# Patient Record
Sex: Female | Born: 1980 | Race: White | Hispanic: No | Marital: Married | Smoking: Former smoker
Health system: Southern US, Community
[De-identification: ages and names within clinical notes are randomized; demographics above are authoritative.]

## PROBLEM LIST (undated history)

## (undated) DIAGNOSIS — Z9889 Other specified postprocedural states: Secondary | ICD-10-CM

## (undated) DIAGNOSIS — M5020 Other cervical disc displacement, unspecified cervical region: Secondary | ICD-10-CM

## (undated) DIAGNOSIS — R112 Nausea with vomiting, unspecified: Secondary | ICD-10-CM

## (undated) DIAGNOSIS — F419 Anxiety disorder, unspecified: Secondary | ICD-10-CM

## (undated) DIAGNOSIS — Z789 Other specified health status: Secondary | ICD-10-CM

## (undated) DIAGNOSIS — K219 Gastro-esophageal reflux disease without esophagitis: Secondary | ICD-10-CM

## (undated) DIAGNOSIS — O021 Missed abortion: Secondary | ICD-10-CM

## (undated) HISTORY — PX: WISDOM TOOTH EXTRACTION: SHX21

---

## 2005-10-26 ENCOUNTER — Other Ambulatory Visit: Admission: RE | Admit: 2005-10-26 | Discharge: 2005-10-26 | Payer: Self-pay | Admitting: Obstetrics and Gynecology

## 2006-06-25 ENCOUNTER — Ambulatory Visit: Payer: Self-pay | Admitting: Family Medicine

## 2006-07-26 ENCOUNTER — Ambulatory Visit: Payer: Self-pay | Admitting: Family Medicine

## 2006-11-22 ENCOUNTER — Other Ambulatory Visit: Admission: RE | Admit: 2006-11-22 | Discharge: 2006-11-22 | Payer: Self-pay | Admitting: Obstetrics and Gynecology

## 2006-12-09 ENCOUNTER — Ambulatory Visit: Payer: Self-pay | Admitting: Family Medicine

## 2007-02-11 ENCOUNTER — Ambulatory Visit: Payer: Self-pay | Admitting: Family Medicine

## 2007-03-17 ENCOUNTER — Ambulatory Visit: Payer: Self-pay | Admitting: Family Medicine

## 2007-08-24 ENCOUNTER — Ambulatory Visit: Payer: Self-pay | Admitting: Family Medicine

## 2008-04-16 ENCOUNTER — Ambulatory Visit: Payer: Self-pay | Admitting: Family Medicine

## 2008-06-06 ENCOUNTER — Encounter: Payer: Self-pay | Admitting: Obstetrics and Gynecology

## 2008-06-06 ENCOUNTER — Ambulatory Visit: Payer: Self-pay | Admitting: Obstetrics and Gynecology

## 2008-06-06 ENCOUNTER — Other Ambulatory Visit: Admission: RE | Admit: 2008-06-06 | Discharge: 2008-06-06 | Payer: Self-pay | Admitting: Obstetrics and Gynecology

## 2009-09-04 ENCOUNTER — Other Ambulatory Visit: Admission: RE | Admit: 2009-09-04 | Discharge: 2009-09-04 | Payer: Self-pay | Admitting: Obstetrics and Gynecology

## 2009-09-04 ENCOUNTER — Ambulatory Visit: Payer: Self-pay | Admitting: Obstetrics and Gynecology

## 2010-04-30 ENCOUNTER — Encounter: Admission: RE | Admit: 2010-04-30 | Discharge: 2010-04-30 | Payer: Self-pay | Admitting: Internal Medicine

## 2010-10-05 NOTE — L&D Delivery Note (Signed)
Delivery Note Pt progressed to complete and pushed well.  Had temp to 100.3 prior to pushing, and 100.6 about 30 minutes prior to delivery.  No antibiotics started due to proximity to delivery.  At 3:19 PM a viable and healthy female was delivered via Vaginal, Spontaneous Delivery (Presentation: Right Occiput Anterior).  APGAR: 9, 9; weight 7 lb 10.8 oz (3481 g).   Posterior (right) arm had hand by face and posterior shoulder delivered first.   Placenta status: Intact, Spontaneous.  Cord: 3 vessels with the following complications: None.   Anesthesia: Epidural  Episiotomy: None Lacerations: 2nd degree Suture Repair: 3.0 vicryl Est. Blood Loss (mL): 400  Mom to postpartum.  Baby to nursery-stable.  Hildy Nicholl D 09/12/2011, 3:52 PM

## 2010-11-03 ENCOUNTER — Ambulatory Visit
Admission: RE | Admit: 2010-11-03 | Discharge: 2010-11-03 | Payer: Self-pay | Source: Home / Self Care | Attending: Obstetrics and Gynecology | Admitting: Obstetrics and Gynecology

## 2010-11-03 ENCOUNTER — Other Ambulatory Visit (HOSPITAL_COMMUNITY)
Admission: RE | Admit: 2010-11-03 | Discharge: 2010-11-03 | Disposition: A | Discharge status: Home / Self Care | Payer: BC Managed Care – PPO | Source: Ambulatory Visit | Attending: Obstetrics and Gynecology | Admitting: Obstetrics and Gynecology

## 2010-11-03 ENCOUNTER — Other Ambulatory Visit: Payer: Self-pay | Admitting: Obstetrics and Gynecology

## 2010-11-03 DIAGNOSIS — Z124 Encounter for screening for malignant neoplasm of cervix: Secondary | ICD-10-CM | POA: Insufficient documentation

## 2011-01-06 ENCOUNTER — Encounter (INDEPENDENT_AMBULATORY_CARE_PROVIDER_SITE_OTHER): Payer: BC Managed Care – PPO | Admitting: Obstetrics and Gynecology

## 2011-01-06 DIAGNOSIS — N912 Amenorrhea, unspecified: Secondary | ICD-10-CM

## 2011-01-13 ENCOUNTER — Other Ambulatory Visit (INDEPENDENT_AMBULATORY_CARE_PROVIDER_SITE_OTHER): Payer: BC Managed Care – PPO

## 2011-01-13 DIAGNOSIS — N912 Amenorrhea, unspecified: Secondary | ICD-10-CM

## 2011-01-28 ENCOUNTER — Other Ambulatory Visit: Payer: BC Managed Care – PPO

## 2011-01-28 ENCOUNTER — Ambulatory Visit (INDEPENDENT_AMBULATORY_CARE_PROVIDER_SITE_OTHER): Payer: BC Managed Care – PPO | Admitting: Obstetrics and Gynecology

## 2011-01-28 DIAGNOSIS — N912 Amenorrhea, unspecified: Secondary | ICD-10-CM

## 2011-01-28 DIAGNOSIS — O9989 Other specified diseases and conditions complicating pregnancy, childbirth and the puerperium: Secondary | ICD-10-CM

## 2011-01-28 DIAGNOSIS — N831 Corpus luteum cyst of ovary, unspecified side: Secondary | ICD-10-CM

## 2011-02-02 ENCOUNTER — Other Ambulatory Visit: Payer: BC Managed Care – PPO

## 2011-02-02 ENCOUNTER — Ambulatory Visit: Payer: BC Managed Care – PPO | Admitting: Obstetrics and Gynecology

## 2011-02-02 ENCOUNTER — Ambulatory Visit (INDEPENDENT_AMBULATORY_CARE_PROVIDER_SITE_OTHER): Payer: BC Managed Care – PPO | Admitting: Obstetrics and Gynecology

## 2011-02-02 DIAGNOSIS — O9989 Other specified diseases and conditions complicating pregnancy, childbirth and the puerperium: Secondary | ICD-10-CM

## 2011-02-02 DIAGNOSIS — N912 Amenorrhea, unspecified: Secondary | ICD-10-CM

## 2011-02-27 LAB — RUBELLA ANTIBODY, IGM: Rubella: IMMUNE

## 2011-02-27 LAB — HEPATITIS B SURFACE ANTIGEN: Hepatitis B Surface Ag: NEGATIVE

## 2011-02-27 LAB — ABO/RH: RH Type: NEGATIVE

## 2011-02-27 LAB — HIV ANTIBODY (ROUTINE TESTING W REFLEX): HIV: NONREACTIVE

## 2011-02-27 LAB — ANTIBODY SCREEN: Antibody Screen: NEGATIVE

## 2011-02-27 LAB — RPR: RPR: NONREACTIVE

## 2011-08-06 LAB — STREP B DNA PROBE: GBS: NEGATIVE

## 2011-09-09 ENCOUNTER — Inpatient Hospital Stay (HOSPITAL_COMMUNITY)
Admission: AD | Admit: 2011-09-09 | Discharge: 2011-09-09 | Disposition: A | Discharge status: Home / Self Care | Payer: BC Managed Care – PPO | Source: Ambulatory Visit | Attending: Obstetrics and Gynecology | Admitting: Obstetrics and Gynecology

## 2011-09-09 ENCOUNTER — Encounter (HOSPITAL_COMMUNITY): Payer: Self-pay | Admitting: *Deleted

## 2011-09-09 DIAGNOSIS — O479 False labor, unspecified: Secondary | ICD-10-CM

## 2011-09-09 HISTORY — DX: Other specified health status: Z78.9

## 2011-09-09 MED ORDER — ZOLPIDEM TARTRATE 5 MG PO TABS: 5.0000 mg | ORAL_TABLET | Freq: Every evening | ORAL | Status: DC | PRN

## 2011-09-09 NOTE — ED Provider Notes (Signed)
History     CSN: [id number] Arrival date & time: 09/09/2011  2:32 PM   None     Pt comes in with contractions, not too uncomfortable but they are frequent  (Consider location/radiation/quality/duration/timing/severity/associated sxs/prior treatment) HPI  Past Medical History  Diagnosis Date  . No pertinent past medical history     Past Surgical History  Procedure Date  . Wisdom tooth extraction   . No past surgeries     No family history on file.  History  Substance Use Topics  . Smoking status: Former Smoker    Quit date: 11/09/2010  . Smokeless tobacco: Not on file  . Alcohol Use: No    OB History    Grav Para Term Preterm Abortions TAB SAB Ect Mult Living   1               Review of Systems  Allergies  Review of patient's allergies indicates no known allergies.  Home Medications  No current outpatient prescriptions on file.  BP 142/83  Pulse 92  Temp(Src) 98.9 F (37.2 C) (Oral)  Resp 20  Ht 5\' 9"  (1.753 m)  Wt 94.802 kg (209 lb)  BMI 30.86 kg/m2  Physical Exam  ED Course  Procedures (including critical care time)  Labs Reviewed - No data to display No results found.   No diagnosis found.    MDM  Pt observed for several hours with no cervical change cervix 70/1-2/-2 posterior.  Pt given script for Ambien and labor precautions.  Will induce next week if does not labor prior.

## 2011-09-09 NOTE — Progress Notes (Signed)
Pt informed that Dr. Senaida Ores will be down to speak with her.

## 2011-09-09 NOTE — Progress Notes (Signed)
Pt states she has been contracting since 1500, and was told that Dr.Richardson was over here.

## 2011-09-09 NOTE — Progress Notes (Signed)
Due tomorrow, ctx's 2-3 min for 2+hrs. Instructed to come here.  No bleeding or leaking.

## 2011-09-09 NOTE — Progress Notes (Signed)
Dr. Senaida Ores called and inform that pt 's cervix has not changed. Dr Senaida Ores states she will come down and talk with pt.

## 2011-09-12 ENCOUNTER — Encounter (HOSPITAL_COMMUNITY): Payer: Self-pay | Admitting: *Deleted

## 2011-09-12 ENCOUNTER — Inpatient Hospital Stay (HOSPITAL_COMMUNITY)
Admission: AD | Admit: 2011-09-12 | Discharge: 2011-09-14 | DRG: 372 | Disposition: A | Discharge status: Home / Self Care | Payer: BC Managed Care – PPO | Source: Ambulatory Visit | Attending: Obstetrics and Gynecology | Admitting: Obstetrics and Gynecology

## 2011-09-12 ENCOUNTER — Encounter (HOSPITAL_COMMUNITY): Payer: Self-pay | Admitting: Anesthesiology

## 2011-09-12 ENCOUNTER — Inpatient Hospital Stay (HOSPITAL_COMMUNITY): Payer: BC Managed Care – PPO | Admitting: Anesthesiology

## 2011-09-12 DIAGNOSIS — Z3490 Encounter for supervision of normal pregnancy, unspecified, unspecified trimester: Secondary | ICD-10-CM

## 2011-09-12 LAB — CBC
HCT: 39.3 % (ref 36.0–46.0)
MCHC: 35.1 g/dL (ref 30.0–36.0)
MCV: 93.8 fL (ref 78.0–100.0)
RDW: 13.3 % (ref 11.5–15.5)

## 2011-09-12 LAB — ABO/RH: ABO/RH(D): B NEG

## 2011-09-12 MED ORDER — IBUPROFEN 600 MG PO TABS: 600.0000 mg | ORAL_TABLET | Freq: Four times a day (QID) | ORAL | Status: DC | PRN

## 2011-09-12 MED ORDER — MEASLES, MUMPS & RUBELLA VAC ~~LOC~~ INJ
0.5000 mL | INJECTION | Freq: Once | SUBCUTANEOUS | Status: DC
  Filled 2011-09-12: qty 0.5

## 2011-09-12 MED ORDER — PHENYLEPHRINE 40 MCG/ML (10ML) SYRINGE FOR IV PUSH (FOR BLOOD PRESSURE SUPPORT): 80.0000 ug | PREFILLED_SYRINGE | INTRAVENOUS | Status: DC | PRN

## 2011-09-12 MED ORDER — PHENYLEPHRINE 40 MCG/ML (10ML) SYRINGE FOR IV PUSH (FOR BLOOD PRESSURE SUPPORT)
80.0000 ug | PREFILLED_SYRINGE | INTRAVENOUS | Status: DC | PRN
  Filled 2011-09-12: qty 5

## 2011-09-12 MED ORDER — TETANUS-DIPHTH-ACELL PERTUSSIS 5-2.5-18.5 LF-MCG/0.5 IM SUSP
0.5000 mL | Freq: Once | INTRAMUSCULAR | Status: AC
  Administered 2011-09-13: 0.5 mL via INTRAMUSCULAR
  Filled 2011-09-12: qty 0.5

## 2011-09-12 MED ORDER — DIPHENHYDRAMINE HCL 25 MG PO CAPS: 25.0000 mg | ORAL_CAPSULE | Freq: Four times a day (QID) | ORAL | Status: DC | PRN

## 2011-09-12 MED ORDER — BENZOCAINE-MENTHOL 20-0.5 % EX AERO
INHALATION_SPRAY | CUTANEOUS | Status: AC
  Filled 2011-09-12: qty 56

## 2011-09-12 MED ORDER — ZOLPIDEM TARTRATE 5 MG PO TABS: 5.0000 mg | ORAL_TABLET | Freq: Every evening | ORAL | Status: DC | PRN

## 2011-09-12 MED ORDER — ONDANSETRON HCL 4 MG PO TABS: 4.0000 mg | ORAL_TABLET | ORAL | Status: DC | PRN

## 2011-09-12 MED ORDER — FENTANYL 2.5 MCG/ML BUPIVACAINE 1/10 % EPIDURAL INFUSION (WH - ANES)
14.0000 mL/h | INTRAMUSCULAR | Status: DC
  Administered 2011-09-12 (×2): 14 mL/h via EPIDURAL
  Filled 2011-09-12 (×2): qty 60

## 2011-09-12 MED ORDER — LACTATED RINGERS IV SOLN: 500.0000 mL | INTRAVENOUS | Status: DC | PRN

## 2011-09-12 MED ORDER — ACETAMINOPHEN 325 MG PO TABS: 650.0000 mg | ORAL_TABLET | ORAL | Status: DC | PRN

## 2011-09-12 MED ORDER — OXYTOCIN BOLUS FROM INFUSION
500.0000 mL | Freq: Once | INTRAVENOUS | Status: DC
  Filled 2011-09-12: qty 500
  Filled 2011-09-12: qty 1000

## 2011-09-12 MED ORDER — OXYTOCIN 20 UNITS IN LACTATED RINGERS INFUSION - SIMPLE
125.0000 mL/h | Freq: Once | INTRAVENOUS | Status: AC
  Administered 2011-09-12: 999 mL/h via INTRAVENOUS

## 2011-09-12 MED ORDER — IBUPROFEN 600 MG PO TABS
600.0000 mg | ORAL_TABLET | Freq: Four times a day (QID) | ORAL | Status: DC
  Administered 2011-09-12 – 2011-09-14 (×7): 600 mg via ORAL
  Filled 2011-09-12 (×7): qty 1

## 2011-09-12 MED ORDER — BENZOCAINE-MENTHOL 20-0.5 % EX AERO
1.0000 "application " | INHALATION_SPRAY | CUTANEOUS | Status: DC | PRN
  Administered 2011-09-13: 1 via TOPICAL

## 2011-09-12 MED ORDER — LACTATED RINGERS IV SOLN
INTRAVENOUS | Status: DC
  Administered 2011-09-12 (×3): via INTRAVENOUS

## 2011-09-12 MED ORDER — EPHEDRINE 5 MG/ML INJ
10.0000 mg | INTRAVENOUS | Status: DC | PRN
  Administered 2011-09-12: 10 mg via INTRAVENOUS
  Filled 2011-09-12: qty 4

## 2011-09-12 MED ORDER — WITCH HAZEL-GLYCERIN EX PADS: 1.0000 "application " | MEDICATED_PAD | CUTANEOUS | Status: DC | PRN

## 2011-09-12 MED ORDER — CITRIC ACID-SODIUM CITRATE 334-500 MG/5ML PO SOLN: 30.0000 mL | ORAL | Status: DC | PRN

## 2011-09-12 MED ORDER — OXYTOCIN 10 UNIT/ML IJ SOLN
INTRAMUSCULAR | Status: AC
  Filled 2011-09-12: qty 2

## 2011-09-12 MED ORDER — OXYCODONE-ACETAMINOPHEN 5-325 MG PO TABS: 1.0000 | ORAL_TABLET | ORAL | Status: DC | PRN

## 2011-09-12 MED ORDER — OXYTOCIN 20 UNITS IN LACTATED RINGERS INFUSION - SIMPLE: 125.0000 mL/h | INTRAVENOUS | Status: DC | PRN

## 2011-09-12 MED ORDER — LANOLIN HYDROUS EX OINT: TOPICAL_OINTMENT | CUTANEOUS | Status: DC | PRN

## 2011-09-12 MED ORDER — ONDANSETRON HCL 4 MG/2ML IJ SOLN: 4.0000 mg | INTRAMUSCULAR | Status: DC | PRN

## 2011-09-12 MED ORDER — DIBUCAINE 1 % RE OINT: 1.0000 "application " | TOPICAL_OINTMENT | RECTAL | Status: DC | PRN

## 2011-09-12 MED ORDER — LIDOCAINE HCL (PF) 1 % IJ SOLN
30.0000 mL | INTRAMUSCULAR | Status: DC | PRN
  Filled 2011-09-12: qty 30

## 2011-09-12 MED ORDER — DIPHENHYDRAMINE HCL 50 MG/ML IJ SOLN
12.5000 mg | INTRAMUSCULAR | Status: DC | PRN
  Administered 2011-09-12 (×2): 12.5 mg via INTRAVENOUS
  Filled 2011-09-12: qty 1

## 2011-09-12 MED ORDER — SIMETHICONE 80 MG PO CHEW: 80.0000 mg | CHEWABLE_TABLET | ORAL | Status: DC | PRN

## 2011-09-12 MED ORDER — EPHEDRINE 5 MG/ML INJ: 10.0000 mg | INTRAVENOUS | Status: DC | PRN

## 2011-09-12 MED ORDER — SENNOSIDES-DOCUSATE SODIUM 8.6-50 MG PO TABS
2.0000 | ORAL_TABLET | Freq: Every day | ORAL | Status: DC
  Administered 2011-09-13: 2 via ORAL

## 2011-09-12 MED ORDER — OXYCODONE-ACETAMINOPHEN 5-325 MG PO TABS
1.0000 | ORAL_TABLET | ORAL | Status: DC | PRN
  Administered 2011-09-13 – 2011-09-14 (×4): 1 via ORAL
  Filled 2011-09-12 (×4): qty 1

## 2011-09-12 MED ORDER — PRENATAL PLUS 27-1 MG PO TABS
1.0000 | ORAL_TABLET | Freq: Every day | ORAL | Status: DC
  Administered 2011-09-13 – 2011-09-14 (×2): 1 via ORAL
  Filled 2011-09-12 (×2): qty 1

## 2011-09-12 MED ORDER — METHYLERGONOVINE MALEATE 0.2 MG/ML IJ SOLN: 0.2000 mg | INTRAMUSCULAR | Status: DC | PRN

## 2011-09-12 MED ORDER — MAGNESIUM HYDROXIDE 400 MG/5ML PO SUSP: 30.0000 mL | ORAL | Status: DC | PRN

## 2011-09-12 MED ORDER — LACTATED RINGERS IV SOLN: 500.0000 mL | Freq: Once | INTRAVENOUS | Status: DC

## 2011-09-12 MED ORDER — ONDANSETRON HCL 4 MG/2ML IJ SOLN
4.0000 mg | Freq: Four times a day (QID) | INTRAMUSCULAR | Status: DC | PRN
  Administered 2011-09-12: 4 mg via INTRAVENOUS
  Filled 2011-09-12: qty 2

## 2011-09-12 MED ORDER — LIDOCAINE HCL 1.5 % IJ SOLN
INTRAMUSCULAR | Status: DC | PRN
  Administered 2011-09-12 (×2): 5 mL via EPIDURAL

## 2011-09-12 MED ORDER — METHYLERGONOVINE MALEATE 0.2 MG PO TABS: 0.2000 mg | ORAL_TABLET | ORAL | Status: DC | PRN

## 2011-09-12 NOTE — H&P (Signed)
Anna Blackwell is a 30 y.o. female, G1 P0, EGA 40+ weeks presenting for evaluation of regular ctx.  On evaluation in MAU, reg ctx, VE 4-5 cm.  Pt admitted and has received an epidural.  Prenatal care essentially uncomplicated, see prenatal records for complete history.  Maternal Medical History:  Reason for admission: Reason for admission: contractions.  Contractions: Frequency: regular.   Perceived severity is strong.    Fetal activity: Perceived fetal activity is normal.      OB History    Grav Para Term Preterm Abortions TAB SAB Ect Mult Living   1              Past Medical History  Diagnosis Date  . No pertinent past medical history    Past Surgical History  Procedure Date  . Wisdom tooth extraction    Family History: family history is not on file. Social History:  reports that she quit smoking about 10 months ago. She has never used smokeless tobacco. She reports that she does not drink alcohol or use illicit drugs.  Review of Systems  Respiratory: Negative.   Cardiovascular: Negative.     Dilation: 4 Effacement (%): 80 Station: -1;0 Exam by:: Dr. Jackelyn Knife Blood pressure 115/62, pulse 86, temperature 99 F (37.2 C), temperature source Axillary, resp. rate 16, height 5\' 9"  (1.753 m), weight 94.802 kg (209 lb), SpO2 97.00%. Maternal Exam:  Uterine Assessment: Contraction strength is firm.  Contraction frequency is regular.   Abdomen: Patient reports no abdominal tenderness. Estimated fetal weight is 8 lbs.   Fetal presentation: vertex  Introitus: Normal vulva. Normal vagina.  Amniotic fluid character: meconium stained.  Pelvis: adequate for delivery.   Cervix: Cervix evaluated by digital exam.   AROM, light meconium  Fetal Exam Fetal Monitor Review: Mode: ultrasound.   Baseline rate: 130.  Variability: moderate (6-25 bpm).   Pattern: accelerations present and no decelerations.    Fetal State Assessment: Category I - tracings are  normal.     Physical Exam  Constitutional: She appears well-developed and well-nourished.  Neck: Neck supple. No thyromegaly present.  Cardiovascular: Normal rate, regular rhythm and normal heart sounds.   No murmur heard. Respiratory: Effort normal and breath sounds normal. No respiratory distress. She has no wheezes.  GI: Soft.       Gravid     Prenatal labs: ABO, Rh: B/Negative/-- (05/25 0000) Antibody: Negative (05/25 0000) Rubella: Immune (05/25 0000) RPR: Nonreactive (05/25 0000)  HBsAg: Negative (05/25 0000)  HIV: Non-reactive (05/25 0000)  GBS: Negative (11/01 0000)   Assessment/Plan: IUP at 40+ weeks in early labor.  AROM done for augmentation, light meconium.  Monitor progress.     Anna Blackwell D 09/12/2011, 9:11 AM

## 2011-09-12 NOTE — Anesthesia Procedure Notes (Signed)
Epidural Patient location during procedure: OB Start time: 09/12/2011 8:20 AM  Staffing Anesthesiologist: Brayton Caves R Performed by: anesthesiologist   Preanesthetic Checklist Completed: patient identified, site marked, surgical consent, pre-op evaluation, timeout performed, IV checked, risks and benefits discussed and monitors and equipment checked  Epidural Patient position: sitting Prep: site prepped and draped and DuraPrep Patient monitoring: continuous pulse ox and blood pressure Approach: midline Injection technique: LOR air and LOR saline  Needle:  Needle type: Tuohy  Needle gauge: 17 G Needle length: 9 cm Needle insertion depth: 5 cm cm Catheter type: closed end flexible Catheter size: 19 Gauge Catheter at skin depth: 10 cm Test dose: negative  Assessment Events: blood not aspirated, injection not painful, no injection resistance, negative IV test and no paresthesia  Additional Notes Patient identified.  Risk benefits discussed including failed block, incomplete pain control, headache, nerve damage, paralysis, blood pressure changes, nausea, vomiting, reactions to medication both toxic or allergic, and postpartum back pain.  Patient expressed understanding and wished to proceed.  All questions were answered.  Sterile technique used throughout procedure and epidural site dressed with sterile barrier dressing. No paresthesia or other complications noted.The patient did not experience any signs of intravascular injection such as tinnitus or metallic taste in mouth nor signs of intrathecal spread such as rapid motor block. Please see nursing notes for vital signs.

## 2011-09-12 NOTE — Progress Notes (Signed)
Pty having ctx on and off all night now 3 mion apart reports good fetal movement and bloody show. No SROM.

## 2011-09-12 NOTE — Anesthesia Preprocedure Evaluation (Signed)

## 2011-09-13 LAB — CBC
HCT: 32.9 % — ABNORMAL LOW (ref 36.0–46.0)
Hemoglobin: 11.1 g/dL — ABNORMAL LOW (ref 12.0–15.0)
MCH: 32.3 pg (ref 26.0–34.0)
MCHC: 33.7 g/dL (ref 30.0–36.0)

## 2011-09-13 MED ORDER — RHO D IMMUNE GLOBULIN 1500 UNIT/2ML IJ SOLN
300.0000 ug | Freq: Once | INTRAMUSCULAR | Status: AC
  Administered 2011-09-13: 300 ug via INTRAMUSCULAR
  Filled 2011-09-13: qty 2

## 2011-09-13 MED ORDER — BENZOCAINE-MENTHOL 20-0.5 % EX AERO
INHALATION_SPRAY | CUTANEOUS | Status: AC
  Administered 2011-09-13: 1 via TOPICAL
  Filled 2011-09-13: qty 56

## 2011-09-13 NOTE — Anesthesia Postprocedure Evaluation (Signed)
  Anesthesia Post-op Note  Patient: Anna Blackwell  Procedure(s) Performed: * No procedures listed *  Patient Location: mother baby  Anesthesia Type: Epidural  Level of Consciousness: awake, alert  and oriented  Airway and Oxygen Therapy: Patient Spontanous Breathing  Post-op Pain: mild  Post-op Assessment: Patient's Cardiovascular Status Stable, Respiratory Function Stable, Patent Airway, No signs of Nausea or vomiting and Pain level controlled  Post-op Vital Signs: stable  Complications: No apparent anesthesia complications

## 2011-09-13 NOTE — Progress Notes (Signed)
PPD#1 No problems Afeb, VSS Fundus- firm, NT at U-1 Continue routine care.  Discussed circumcision procedure and risks.

## 2011-09-14 LAB — RH IG WORKUP (INCLUDES ABO/RH)

## 2011-09-14 MED ORDER — INFLUENZA VIRUS VACC SPLIT PF IM SUSP
0.5000 mL | Freq: Once | INTRAMUSCULAR | Status: AC
  Administered 2011-09-14: 0.5 mL via INTRAMUSCULAR
  Filled 2011-09-14: qty 0.5

## 2011-09-14 NOTE — Discharge Summary (Signed)
Obstetric Discharge Summary Reason for Admission: onset of labor Prenatal Procedures: none Intrapartum Procedures: spontaneous vaginal delivery Postpartum Procedures: none Complications-Operative and Postpartum: 2nd degree perineal laceration Hemoglobin  Date Value Range Status  09/13/2011 11.1* 12.0-15.0 (g/dL) Final     DELTA CHECK NOTED     REPEATED TO VERIFY     HCT  Date Value Range Status  09/13/2011 32.9* 36.0-46.0 (%) Final    Discharge Diagnoses: Term Pregnancy-delivered  Discharge Information: Date: 09/14/2011 Activity: pelvic rest Diet: routine Medications: Ibuprofen Condition: stable Instructions: refer to practice specific booklet Discharge to: home Follow-up Information    Follow up with Thana Ramp D, MD. Make an appointment in 6 weeks.   Contact information:   [address] [place] [address] [phone]          Newborn Data: Live born female  Birth Weight: 7 lb 10.8 oz (3481 g) APGAR: 9, 9  Home with mother.  Rona Tomson D 09/14/2011, 9:03 AM

## 2011-09-14 NOTE — Progress Notes (Signed)
PPD #2 No problems Afeb, VSS D/c home 

## 2012-05-03 ENCOUNTER — Other Ambulatory Visit: Payer: Self-pay | Admitting: Obstetrics and Gynecology

## 2012-05-03 DIAGNOSIS — Z1231 Encounter for screening mammogram for malignant neoplasm of breast: Secondary | ICD-10-CM

## 2012-08-12 ENCOUNTER — Ambulatory Visit: Payer: BC Managed Care – PPO

## 2012-09-19 ENCOUNTER — Ambulatory Visit
Admission: RE | Admit: 2012-09-19 | Discharge: 2012-09-19 | Disposition: A | Discharge status: Home / Self Care | Payer: BC Managed Care – PPO | Source: Ambulatory Visit | Attending: Obstetrics and Gynecology | Admitting: Obstetrics and Gynecology

## 2012-09-19 DIAGNOSIS — Z1231 Encounter for screening mammogram for malignant neoplasm of breast: Secondary | ICD-10-CM

## 2012-10-11 ENCOUNTER — Ambulatory Visit: Payer: BC Managed Care – PPO

## 2013-11-06 ENCOUNTER — Other Ambulatory Visit: Payer: Self-pay | Admitting: Dermatology

## 2014-03-23 ENCOUNTER — Ambulatory Visit (INDEPENDENT_AMBULATORY_CARE_PROVIDER_SITE_OTHER): Payer: BC Managed Care – PPO | Admitting: Internal Medicine

## 2014-03-23 ENCOUNTER — Ambulatory Visit (INDEPENDENT_AMBULATORY_CARE_PROVIDER_SITE_OTHER): Payer: BC Managed Care – PPO

## 2014-03-23 VITALS — BP 106/72 | HR 79 | Temp 98.1°F | Resp 18 | Ht 68.0 in | Wt 158.4 lb

## 2014-03-23 DIAGNOSIS — R071 Chest pain on breathing: Secondary | ICD-10-CM

## 2014-03-23 DIAGNOSIS — R0789 Other chest pain: Secondary | ICD-10-CM

## 2014-03-23 DIAGNOSIS — R059 Cough, unspecified: Secondary | ICD-10-CM

## 2014-03-23 DIAGNOSIS — R05 Cough: Secondary | ICD-10-CM

## 2014-03-23 MED ORDER — HYDROCODONE-HOMATROPINE 5-1.5 MG/5ML PO SYRP: 5.0000 mL | ORAL_SOLUTION | Freq: Four times a day (QID) | ORAL | Status: DC | PRN

## 2014-03-23 MED ORDER — PREDNISONE 20 MG PO TABS: ORAL_TABLET | ORAL | Status: DC

## 2014-03-23 MED ORDER — FLUTICASONE PROPIONATE 50 MCG/ACT NA SUSP: 2.0000 | Freq: Every day | NASAL | Status: DC

## 2014-03-23 MED ORDER — MELOXICAM 15 MG PO TABS: 15.0000 mg | ORAL_TABLET | Freq: Every day | ORAL | Status: DC

## 2014-03-23 NOTE — Progress Notes (Addendum)
Subjective:  This chart was scribed for Anna Siaobert Doolittle, MD by Quintella ReichertMatthew Underwood, Scribe.  [facility] and the patient's care was started at 10:47 AM.   Patient ID: Anna Blackwell, female    DOB: [birth year], 33 y.o.   MRN: [id number]  Chief Complaint  Patient presents with   Cough    coughing so much and hard that she thinks she cracked her rib x 2 weeks    HPI  HPI Comments: Anna GlassChanteal S Nolasco is a 33 y.o. female who [facility] of right rib pain secondary to a cough.  She states she has had persistent cold symptoms for the past few weeks which she characterizes as coughing fits, rhinorrhea, and congestion.  This has been improving overall but she has continued to have a cough. Cough was waking her up at night but no longer is.  She had a sore throat initially but this has since resolved.  She reports some night sweats last week as well.  She also states she is sneezing.  She denies associated SOB, fevers, vomiting, diarrhea, constipation, ear problems, eye itching or discharge.  She has been taking Robitussin and Benadryl.  She admits to h/o seasonal allergies when she moved here several years ago but states she does not typically have allergies.  4 days ago she developed mild right rib pain which has persisted since then.  This morning at work during a coughing fit she "felt my rib crack" and had sudden onset of severe pain to her right ribs with associated SOB.  She states she was unable to stand straight due to her pain.  Pt notes that she had a similar episode of pain in the same area last year, also due to coughing.  Pt has an IUD.   Patient Active Problem List   Diagnosis Date Noted   Normal pregnancy 09/12/2011   SVD (spontaneous vaginal delivery) 09/12/2011    Past Medical History  Diagnosis Date   No pertinent past medical history     Past Surgical History  Procedure Laterality Date   Wisdom tooth extraction       Prior to Admission medications   Medication Sig Start Date End Date Taking? Authorizing Provider  acetaminophen (TYLENOL) 500 MG tablet Take 1,000 mg by mouth every 6 (six) hours as needed. Patient took this medication for back pain.     Historical Provider, MD  zolpidem (AMBIEN) 5 MG tablet Take 1 tablet (5 mg total) by mouth at bedtime as needed for sleep. 09/09/11 09/08/12  Oliver PilaKathy W Richardson, MD     Review of Systems  Constitutional: Negative for fever, activity change, appetite change, fatigue and unexpected weight change.       One episode of night sweats  HENT: Positive for congestion, rhinorrhea, sneezing and sore throat (resolved). Negative for ear pain and hearing loss.   Eyes: Negative for discharge and itching.  Respiratory: Positive for cough. Negative for shortness of breath.   Cardiovascular: Positive for chest pain (right rib pain).  Gastrointestinal: Negative for vomiting, diarrhea and constipation.        Objective:   Physical Exam  Nursing note and vitals reviewed. Constitutional: She is oriented to person, place, and time. She appears well-developed and well-nourished. No distress.  HENT:  Head: Normocephalic and atraumatic.  Right Ear: Tympanic membrane and ear canal normal.  Left Ear: Tympanic membrane and ear canal normal.  Mouth/Throat: Oropharynx is clear and moist and mucous  membranes are normal. No oropharyngeal exudate, posterior oropharyngeal edema or posterior oropharyngeal erythema.  Nose has boggy turbinates with clear rhinorrhea  Eyes: Conjunctivae and EOM are normal. Pupils are equal, round, and reactive to light. Right eye exhibits no discharge. Left eye exhibits no discharge. No scleral icterus.  Neck: Neck supple. No tracheal deviation present. No thyromegaly present.  Cardiovascular: Normal rate and regular rhythm.   No murmur heard. Pulmonary/Chest: Effort normal and breath sounds normal. No respiratory distress. She has no wheezes. She has  no rales. She exhibits tenderness (very tender to right lower anterior and lateral ribs, without ecchymosis or defect).  Musculoskeletal: Normal range of motion.  Lymphadenopathy:    She has no cervical adenopathy.  Neurological: She is alert and oriented to person, place, and time.  Skin: Skin is warm and dry.  Psychiatric: She has a normal mood and affect. Her behavior is normal.     BP 106/72   Pulse 79   Temp(Src) 98.1 F (36.7 C) (Oral)   Resp 18   Ht 5\' 8"  (1.727 m)   Wt 158 lb 6.4 oz (71.85 kg)   BMI 24.09 kg/m2   SpO2 100%  [facility] reading (PRIMARY) by  Dr. Merla Richesoolittle= No rib Fx/Chest clear      Assessment & Plan:  Chest wall pain - this is consistent with a chest muscle tear coughing Cough--very likely related to persistent post nasal drainage with inflammation in the trachea  Underlying allergic rhinitis is possible  Rib belt Progressive stretching Ice as needed  Meds ordered this encounter  Medications   meloxicam (MOBIC) 15 MG tablet    Sig: Take 1 tablet (15 mg total) by mouth daily.    Dispense:  30 tablet    Refill:  0   predniSONE (DELTASONE) 20 MG tablet    Sig: 3/3/2/2/1/1 single daily dose for 6 days    Dispense:  12 tablet    Refill:  0   fluticasone (FLONASE) 50 MCG/ACT nasal spray    Sig: Place 2 sprays into both nostrils daily.    Dispense:  16 g    Refill:  6   HYDROcodone-homatropine (HYCODAN) 5-1.5 MG/5ML syrup    Sig: Take 5 mLs by mouth every 6 (six) hours as needed for cough.    Dispense:  120 mL    Refill:  0   Recheck 2 weeks if not responding      I have completed the patient encounter in its entirety as documented by the scribe, with editing by me where necessary. Robert P. Merla Richesoolittle, M.D.

## 2015-07-20 IMAGING — CR DG RIBS W/ CHEST 3+V*R*
4 series · 4 of 4 positions shown · non-contrast
Comparison: No previous studies are available for comparison.

CLINICAL DATA: Chest wall pain

EXAM:
RIGHT RIBS AND CHEST - 3+ VIEW

[PA]
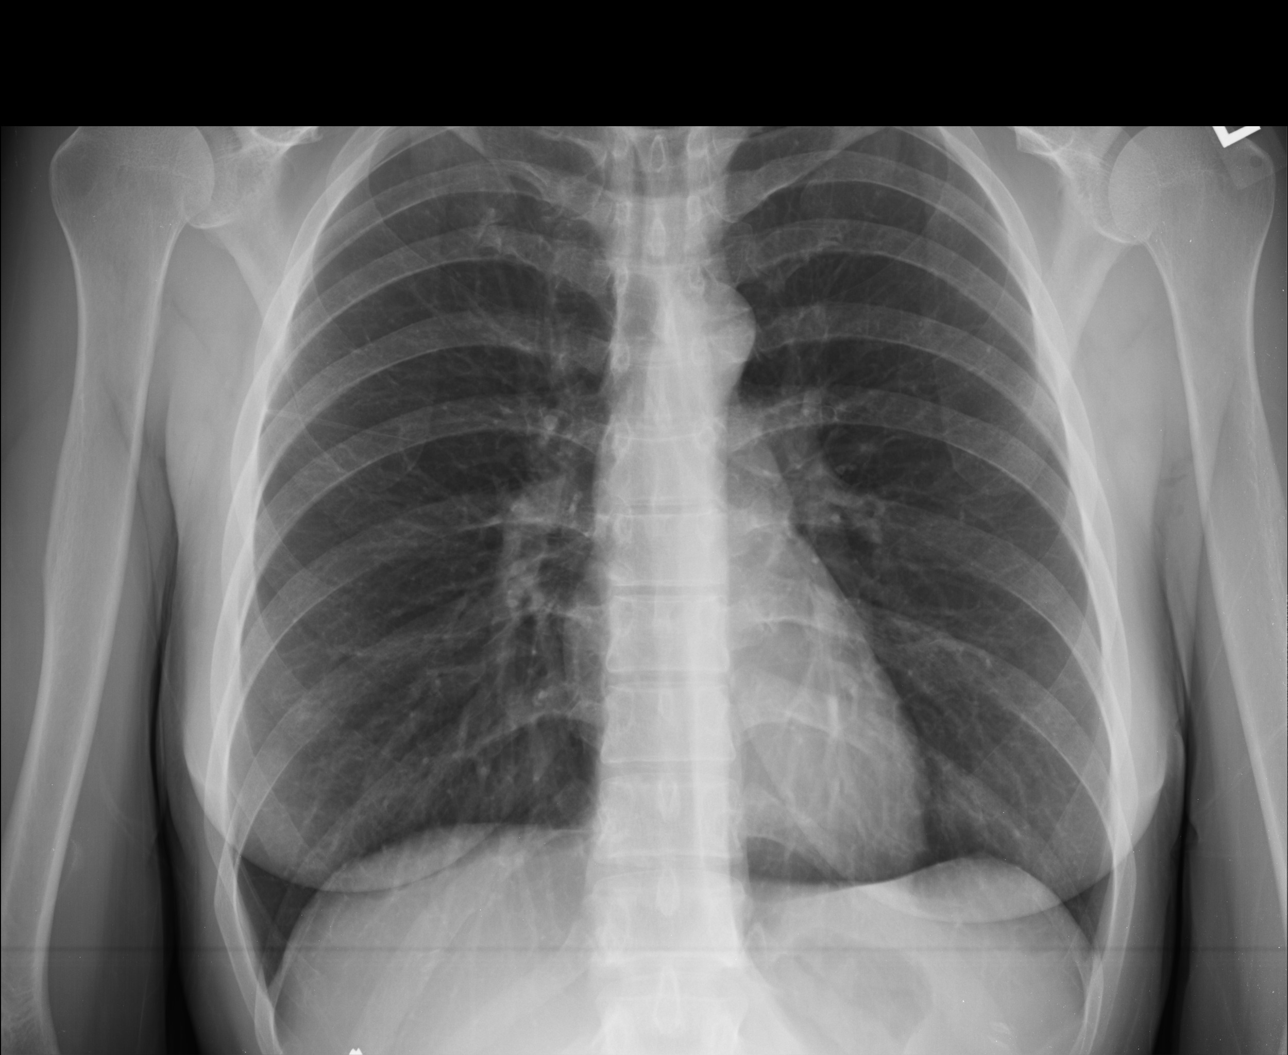

[rao]
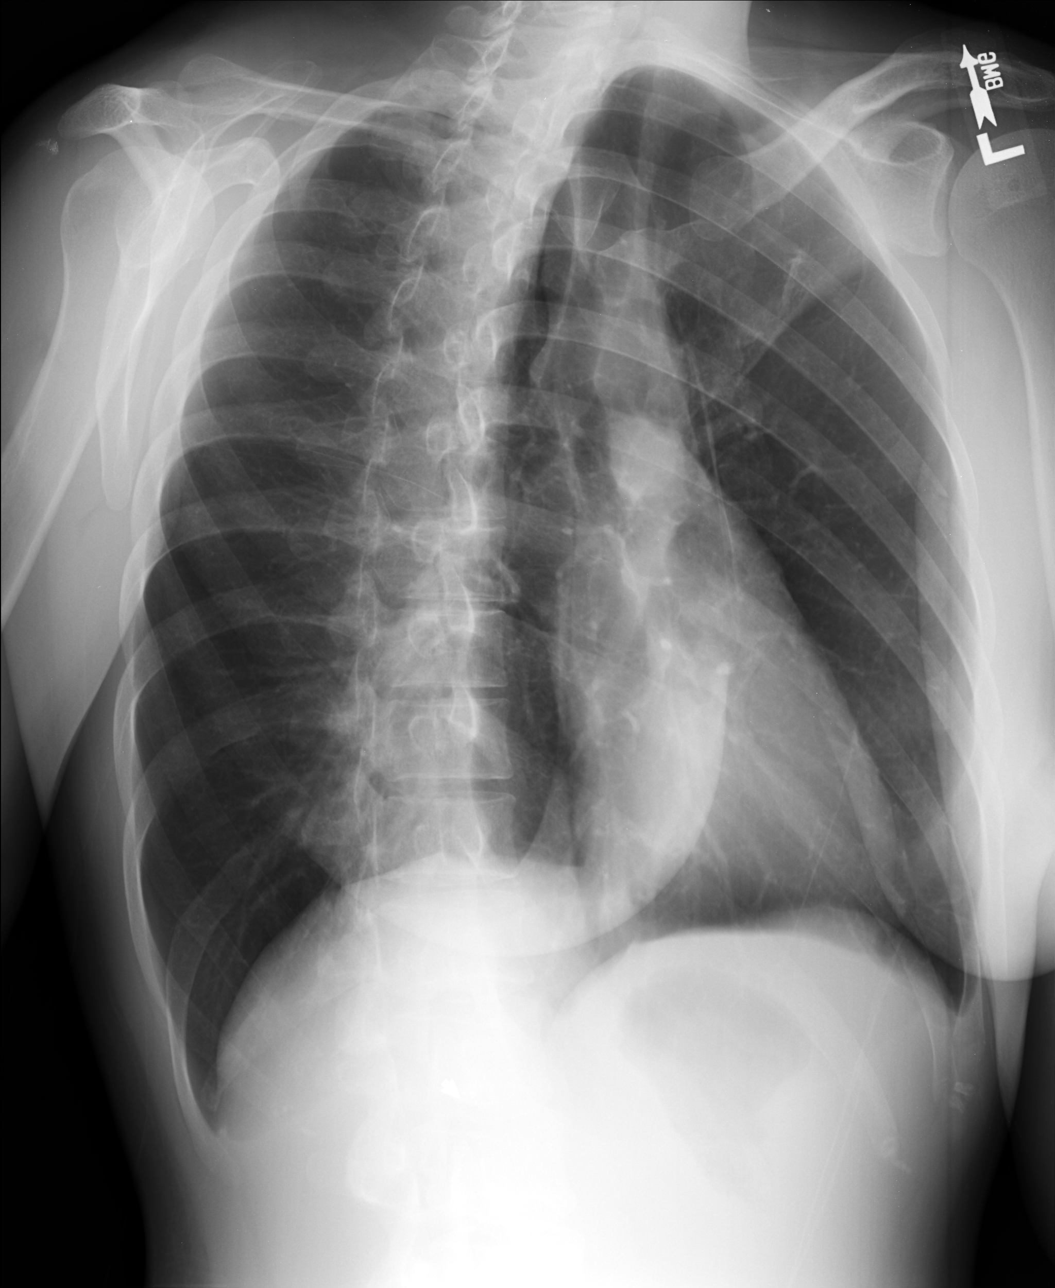

[lao]
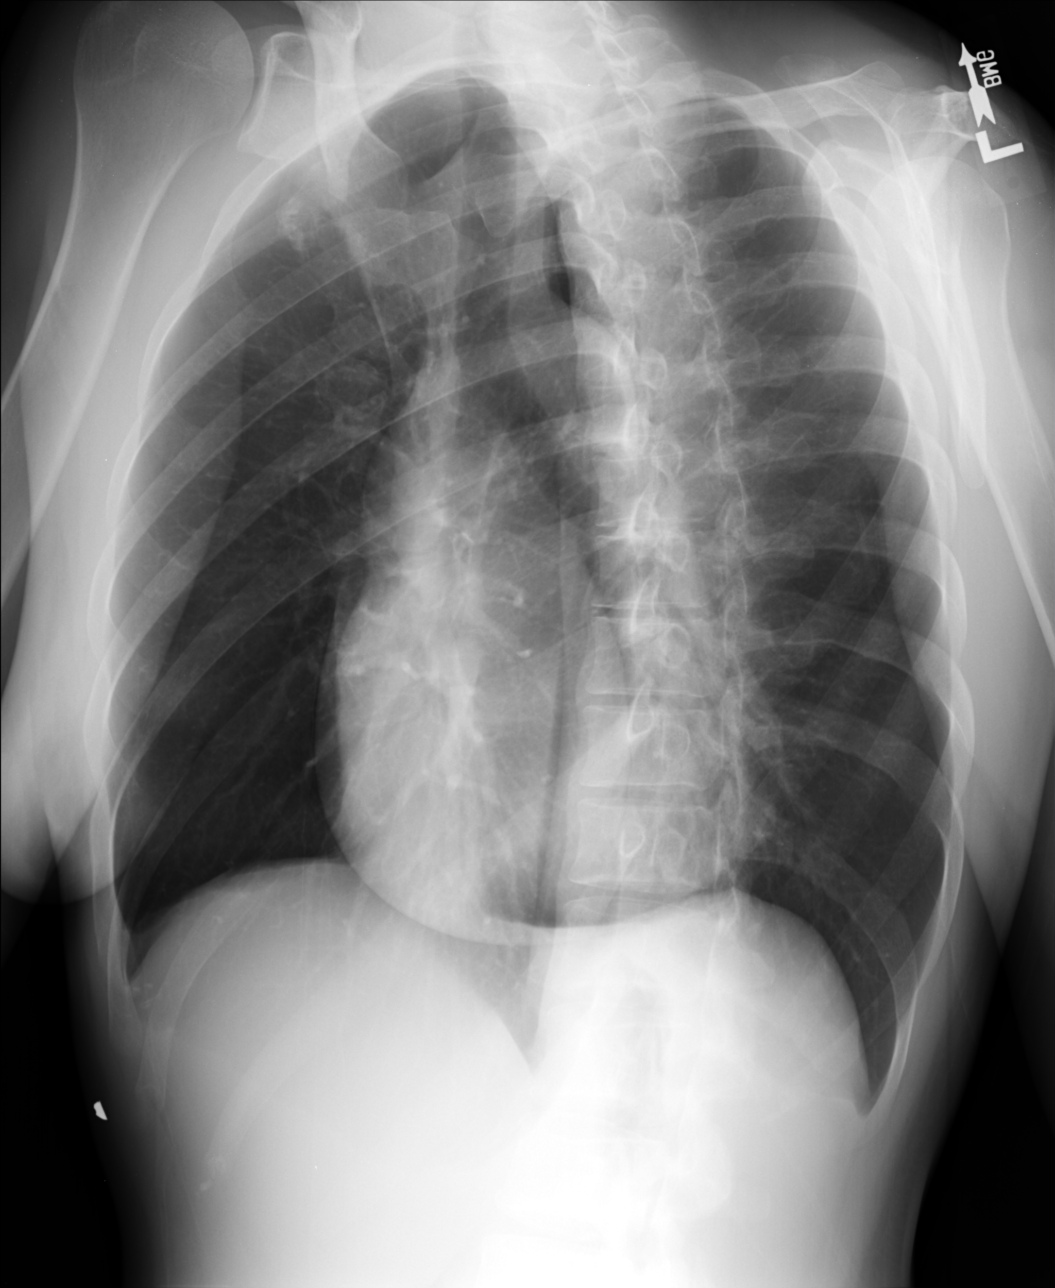

[AP]
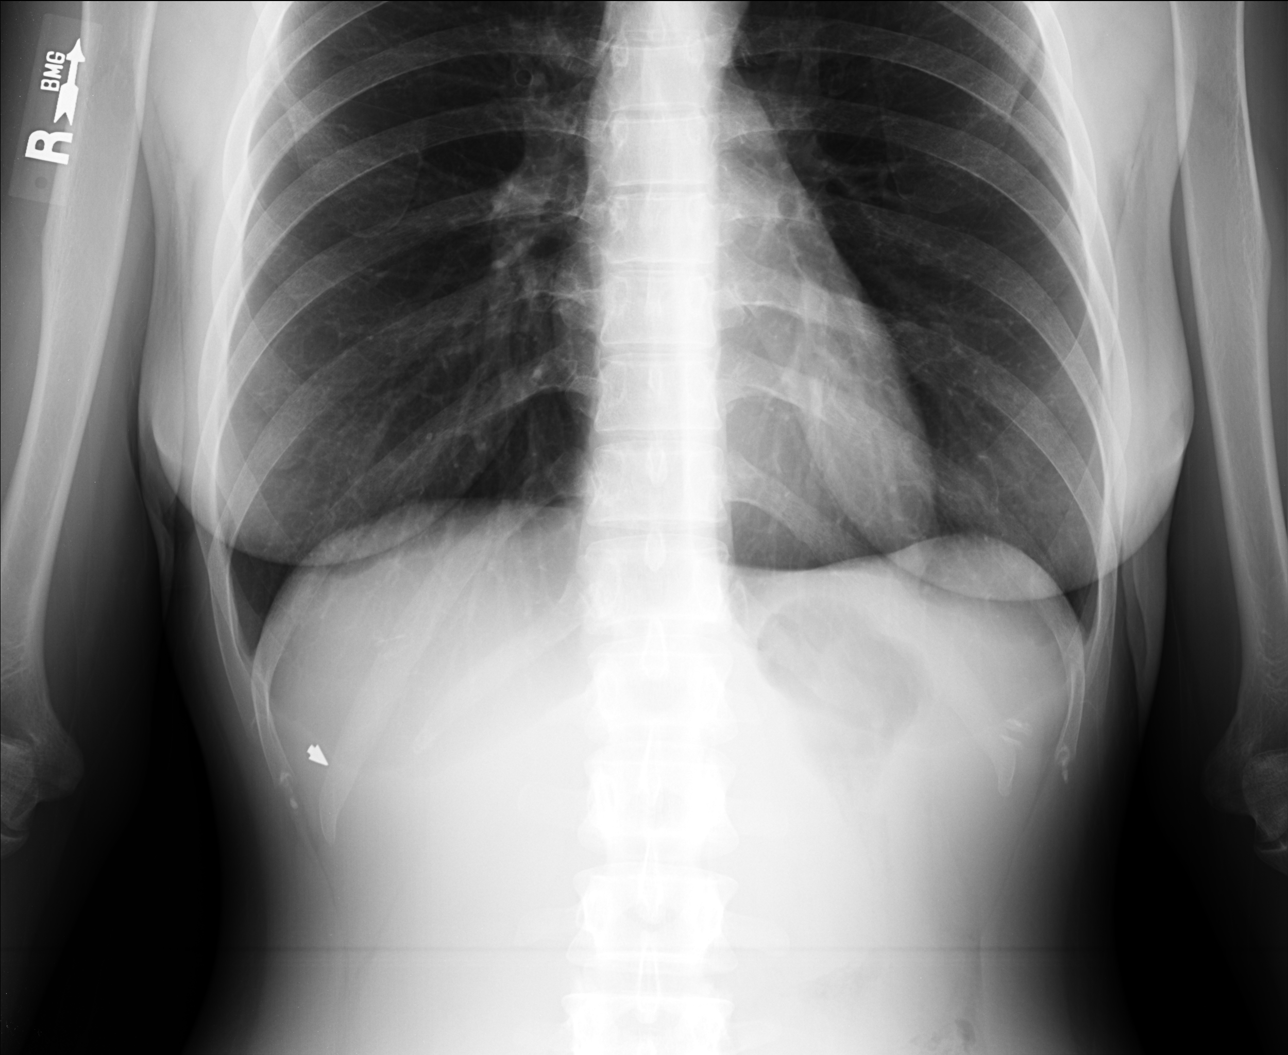

[4 of 4 positions shown; findings below may reference images not displayed]

FINDINGS: The lungs are hyperinflated and clear. The heart and mediastinal
structures are normal. There is no pleural effusion or pneumothorax.
Bilateral rib detail films reveal the ribs to be adequately
mineralized. There is no acute fracture. Subtle lucency in the
lateral aspect of the left fifth rib is seen on one view only and
fell to not reflect a true fracture.
IMPRESSION: There is no acute cardiopulmonary abnormality. No definite rib
fracture is demonstrated.

## 2016-02-11 DIAGNOSIS — N911 Secondary amenorrhea: Secondary | ICD-10-CM | POA: Diagnosis not present

## 2016-02-11 DIAGNOSIS — Z3201 Encounter for pregnancy test, result positive: Secondary | ICD-10-CM | POA: Diagnosis not present

## 2016-03-03 DIAGNOSIS — Z3481 Encounter for supervision of other normal pregnancy, first trimester: Secondary | ICD-10-CM | POA: Diagnosis not present

## 2016-03-03 DIAGNOSIS — Z36 Encounter for antenatal screening of mother: Secondary | ICD-10-CM | POA: Diagnosis not present

## 2016-03-03 DIAGNOSIS — O26841 Uterine size-date discrepancy, first trimester: Secondary | ICD-10-CM | POA: Diagnosis not present

## 2016-03-03 DIAGNOSIS — Z3A09 9 weeks gestation of pregnancy: Secondary | ICD-10-CM | POA: Diagnosis not present

## 2016-03-03 DIAGNOSIS — O3680X0 Pregnancy with inconclusive fetal viability, not applicable or unspecified: Secondary | ICD-10-CM | POA: Diagnosis not present

## 2016-03-24 DIAGNOSIS — O021 Missed abortion: Secondary | ICD-10-CM | POA: Diagnosis not present

## 2016-03-24 DIAGNOSIS — O020 Blighted ovum and nonhydatidiform mole: Secondary | ICD-10-CM | POA: Diagnosis not present

## 2016-03-27 ENCOUNTER — Encounter (HOSPITAL_COMMUNITY): Payer: Self-pay

## 2016-03-28 ENCOUNTER — Encounter (HOSPITAL_COMMUNITY): Payer: Self-pay | Admitting: Obstetrics and Gynecology

## 2016-03-28 DIAGNOSIS — O021 Missed abortion: Secondary | ICD-10-CM

## 2016-03-28 HISTORY — DX: Missed abortion: O02.1

## 2016-03-28 NOTE — H&P (Signed)
Anna Blackwell is an 35 y.o. female G2P1011 with Missed Ab, measuring 9 week.  Normal early US, when presented for Nuchal thickness US, missed Ab noted.  Pt tried cytotec without results, decided to proceed with D&C.  D/W pt r/b/a of procedure.    Pertinent Gynecological History: OB History: G2, P1001 G1 40wk SVD 7#10 G2 present  No abn pap, last 10/15 WNL No STDs  Menstrual History:  Patient's last menstrual period was 12/29/2015 (approximate).    Past Medical History  Diagnosis Date  . No pertinent past medical history   . Anxiety   . PONV (postoperative nausea and vomiting)   . Missed abortion 03/28/2016    Past Surgical History  Procedure Laterality Date  . Wisdom tooth extraction      FH: Alzheimer's disease, HTN, prostate CA, ovarian CA, brain CA, breast CA  Social History:  reports that she quit smoking about 5 years ago. She has never used smokeless tobacco. She reports that she does not drink alcohol or use illicit drugs.Social research officer, [employer], married  Allergies: No Known Allergies  No prescriptions prior to admission  Meds cytotec, ibuprofen, phenergan, percocet, PNV  Review of Systems  Constitutional: Negative.   HENT: Negative.   Eyes: Negative.   Respiratory: Negative.   Cardiovascular: Negative.   Gastrointestinal: Negative.   Genitourinary: Negative.   Musculoskeletal: Negative.   Skin: Negative.   Neurological: Negative.   Psychiatric/Behavioral: Negative.     Last menstrual period 12/29/2015, unknown if currently breastfeeding. Physical Exam  Constitutional: She is oriented to person, place, and time. She appears well-developed and well-nourished.  Anxious and upset  HENT:  Head: Normocephalic and atraumatic.  Cardiovascular: Normal rate and regular rhythm.   Respiratory: Effort normal and breath sounds normal. No respiratory distress. She has no wheezes.  GI: Soft. Bowel sounds are normal. She exhibits no distension. There is no tenderness.   Musculoskeletal: Normal range of motion.  Neurological: She is alert and oriented to person, place, and time.  Skin: Skin is warm and dry.  Psychiatric: She has a normal mood and affect. Her behavior is normal.   Blood type B neg  Nl 9wk US At 12 wk US measuring 9wk size, no FHTs  Assessment/Plan: 34yo G2P1001 with missed ab for D&C D/w pt r/b/a Doxy for prophylaxis   Bovard-Stuckert, Anna Blackwell 03/28/2016, 11:01 PM

## 2016-03-30 ENCOUNTER — Encounter (HOSPITAL_COMMUNITY)
Admission: RE | Disposition: A | Discharge status: Home / Self Care | Payer: Self-pay | Source: Ambulatory Visit | Attending: Obstetrics and Gynecology

## 2016-03-30 ENCOUNTER — Ambulatory Visit (HOSPITAL_COMMUNITY): Payer: BLUE CROSS/BLUE SHIELD | Admitting: Anesthesiology

## 2016-03-30 ENCOUNTER — Ambulatory Visit (HOSPITAL_COMMUNITY): Payer: BLUE CROSS/BLUE SHIELD

## 2016-03-30 ENCOUNTER — Ambulatory Visit (HOSPITAL_COMMUNITY)
Admission: RE | Admit: 2016-03-30 | Discharge: 2016-03-30 | Disposition: A | Discharge status: Home / Self Care | Payer: BLUE CROSS/BLUE SHIELD | Source: Ambulatory Visit | Attending: Obstetrics and Gynecology | Admitting: Obstetrics and Gynecology

## 2016-03-30 ENCOUNTER — Encounter (HOSPITAL_COMMUNITY): Payer: Self-pay | Admitting: *Deleted

## 2016-03-30 DIAGNOSIS — O021 Missed abortion: Secondary | ICD-10-CM | POA: Diagnosis not present

## 2016-03-30 DIAGNOSIS — Z3A09 9 weeks gestation of pregnancy: Secondary | ICD-10-CM | POA: Diagnosis not present

## 2016-03-30 DIAGNOSIS — Z87891 Personal history of nicotine dependence: Secondary | ICD-10-CM | POA: Insufficient documentation

## 2016-03-30 DIAGNOSIS — F419 Anxiety disorder, unspecified: Secondary | ICD-10-CM | POA: Insufficient documentation

## 2016-03-30 DIAGNOSIS — Z9889 Other specified postprocedural states: Secondary | ICD-10-CM

## 2016-03-30 HISTORY — DX: Other specified postprocedural states: Z98.890

## 2016-03-30 HISTORY — DX: Missed abortion: O02.1

## 2016-03-30 HISTORY — PX: DILATION AND EVACUATION: SHX1459

## 2016-03-30 HISTORY — DX: Nausea with vomiting, unspecified: R11.2

## 2016-03-30 HISTORY — DX: Anxiety disorder, unspecified: F41.9

## 2016-03-30 LAB — CBC
HCT: 41.6 % (ref 36.0–46.0)
Hemoglobin: 14.4 g/dL (ref 12.0–15.0)
MCH: 31.2 pg (ref 26.0–34.0)
MCHC: 34.6 g/dL (ref 30.0–36.0)
MCV: 90.2 fL (ref 78.0–100.0)
Platelets: 137 K/uL — ABNORMAL LOW (ref 150–400)
RBC: 4.61 MIL/uL (ref 3.87–5.11)
RDW: 12.9 % (ref 11.5–15.5)
WBC: 5.8 K/uL (ref 4.0–10.5)

## 2016-03-30 LAB — TYPE AND SCREEN
ABO/RH(D): B NEG
ANTIBODY SCREEN: NEGATIVE

## 2016-03-30 SURGERY — DILATION AND EVACUATION, UTERUS
Anesthesia: Monitor Anesthesia Care

## 2016-03-30 MED ORDER — SCOPOLAMINE 1 MG/3DAYS TD PT72
MEDICATED_PATCH | TRANSDERMAL | Status: AC
  Administered 2016-03-30: 1.5 mg via TRANSDERMAL
  Filled 2016-03-30: qty 1

## 2016-03-30 MED ORDER — 0.9 % SODIUM CHLORIDE (POUR BTL) OPTIME
TOPICAL | Status: DC | PRN
  Administered 2016-03-30: 1000 mL

## 2016-03-30 MED ORDER — ACETAMINOPHEN 160 MG/5ML PO SOLN: 325.0000 mg | ORAL | Status: DC | PRN

## 2016-03-30 MED ORDER — RHO D IMMUNE GLOBULIN 1500 UNIT/2ML IJ SOSY
300.0000 ug | PREFILLED_SYRINGE | Freq: Once | INTRAMUSCULAR | Status: AC
  Administered 2016-03-30: 300 ug via INTRAMUSCULAR
  Filled 2016-03-30: qty 2

## 2016-03-30 MED ORDER — ACETAMINOPHEN 325 MG PO TABS: 325.0000 mg | ORAL_TABLET | ORAL | Status: DC | PRN

## 2016-03-30 MED ORDER — FENTANYL CITRATE (PF) 100 MCG/2ML IJ SOLN: 25.0000 ug | INTRAMUSCULAR | Status: DC | PRN

## 2016-03-30 MED ORDER — ONDANSETRON HCL 4 MG/2ML IJ SOLN
INTRAMUSCULAR | Status: DC | PRN
  Administered 2016-03-30: 4 mg via INTRAVENOUS

## 2016-03-30 MED ORDER — FENTANYL CITRATE (PF) 100 MCG/2ML IJ SOLN
INTRAMUSCULAR | Status: DC | PRN
Start: 2016-03-30 — End: 2016-03-30
  Administered 2016-03-30: 100 ug via INTRAVENOUS

## 2016-03-30 MED ORDER — PROPOFOL 10 MG/ML IV BOLUS
INTRAVENOUS | Status: DC | PRN
  Administered 2016-03-30: 30 mg via INTRAVENOUS
  Administered 2016-03-30: 50 mg via INTRAVENOUS
  Administered 2016-03-30 (×2): 30 mg via INTRAVENOUS
  Administered 2016-03-30: 50 mg via INTRAVENOUS
  Administered 2016-03-30 (×2): 30 mg via INTRAVENOUS

## 2016-03-30 MED ORDER — DOXYCYCLINE HYCLATE 100 MG PO TABS
100.0000 mg | ORAL_TABLET | Freq: Once | ORAL | Status: AC
  Administered 2016-03-30: 100 mg via ORAL

## 2016-03-30 MED ORDER — DOXYCYCLINE HYCLATE 100 MG PO TABS
ORAL_TABLET | ORAL | Status: AC
  Administered 2016-03-30: 100 mg via ORAL
  Filled 2016-03-30: qty 2

## 2016-03-30 MED ORDER — LACTATED RINGERS IV SOLN: INTRAVENOUS | Status: DC

## 2016-03-30 MED ORDER — MIDAZOLAM HCL 2 MG/2ML IJ SOLN
INTRAMUSCULAR | Status: AC
  Filled 2016-03-30: qty 2

## 2016-03-30 MED ORDER — LACTATED RINGERS IV SOLN
INTRAVENOUS | Status: DC
  Administered 2016-03-30 (×2): via INTRAVENOUS

## 2016-03-30 MED ORDER — SODIUM CHLORIDE 0.9 % IV SOLN
INTRAVENOUS | Status: DC
  Administered 2016-03-30: 14:00:00 via INTRAVENOUS

## 2016-03-30 MED ORDER — ONDANSETRON HCL 4 MG/2ML IJ SOLN
INTRAMUSCULAR | Status: AC
  Filled 2016-03-30: qty 2

## 2016-03-30 MED ORDER — PROPOFOL 10 MG/ML IV BOLUS
INTRAVENOUS | Status: AC
  Filled 2016-03-30: qty 20

## 2016-03-30 MED ORDER — IBUPROFEN 800 MG PO TABS: 800.0000 mg | ORAL_TABLET | Freq: Three times a day (TID) | ORAL | Status: DC | PRN

## 2016-03-30 MED ORDER — LIDOCAINE HCL (CARDIAC) 20 MG/ML IV SOLN
INTRAVENOUS | Status: AC
  Filled 2016-03-30: qty 5

## 2016-03-30 MED ORDER — LIDOCAINE HCL (CARDIAC) 20 MG/ML IV SOLN
INTRAVENOUS | Status: DC | PRN
  Administered 2016-03-30: 40 mg via INTRAVENOUS

## 2016-03-30 MED ORDER — MIDAZOLAM HCL 5 MG/5ML IJ SOLN
INTRAMUSCULAR | Status: DC | PRN
  Administered 2016-03-30: 2 mg via INTRAVENOUS

## 2016-03-30 MED ORDER — KETOROLAC TROMETHAMINE 30 MG/ML IJ SOLN
INTRAMUSCULAR | Status: AC
  Filled 2016-03-30: qty 1

## 2016-03-30 MED ORDER — FENTANYL CITRATE (PF) 250 MCG/5ML IJ SOLN
INTRAMUSCULAR | Status: AC
  Filled 2016-03-30: qty 5

## 2016-03-30 MED ORDER — LIDOCAINE HCL 2 % IJ SOLN
INTRAMUSCULAR | Status: DC | PRN
  Administered 2016-03-30: 20 mL

## 2016-03-30 MED ORDER — OXYCODONE HCL 5 MG/5ML PO SOLN: 5.0000 mg | Freq: Once | ORAL | Status: DC | PRN

## 2016-03-30 MED ORDER — DEXAMETHASONE SODIUM PHOSPHATE 4 MG/ML IJ SOLN
INTRAMUSCULAR | Status: DC | PRN
  Administered 2016-03-30: 4 mg via INTRAVENOUS

## 2016-03-30 MED ORDER — OXYCODONE HCL 5 MG PO TABS: 5.0000 mg | ORAL_TABLET | Freq: Once | ORAL | Status: DC | PRN

## 2016-03-30 MED ORDER — KETOROLAC TROMETHAMINE 30 MG/ML IJ SOLN
INTRAMUSCULAR | Status: DC | PRN
  Administered 2016-03-30: 30 mg via INTRAVENOUS

## 2016-03-30 MED ORDER — SCOPOLAMINE 1 MG/3DAYS TD PT72
1.0000 | MEDICATED_PATCH | Freq: Once | TRANSDERMAL | Status: DC
  Administered 2016-03-30: 1.5 mg via TRANSDERMAL

## 2016-03-30 MED ORDER — LIDOCAINE HCL 2 % IJ SOLN
INTRAMUSCULAR | Status: AC
  Filled 2016-03-30: qty 20

## 2016-03-30 MED ORDER — DOXYCYCLINE HYCLATE 100 MG PO TABS: 200.0000 mg | ORAL_TABLET | Freq: Once | ORAL | Status: DC

## 2016-03-30 MED ORDER — DEXAMETHASONE SODIUM PHOSPHATE 4 MG/ML IJ SOLN
INTRAMUSCULAR | Status: AC
  Filled 2016-03-30: qty 1

## 2016-03-30 SURGICAL SUPPLY — 18 items
CATH ROBINSON RED A/P 16FR (CATHETERS) ×2 IMPLANT
CLOTH BEACON ORANGE TIMEOUT ST (SAFETY) ×2 IMPLANT
DECANTER SPIKE VIAL GLASS SM (MISCELLANEOUS) ×2 IMPLANT
GLOVE BIO SURGEON STRL SZ 6.5 (GLOVE) ×2 IMPLANT
GLOVE BIOGEL PI IND STRL 7.0 (GLOVE) ×1 IMPLANT
GLOVE BIOGEL PI INDICATOR 7.0 (GLOVE) ×1
GOWN STRL REUS W/TWL LRG LVL3 (GOWN DISPOSABLE) ×4 IMPLANT
KIT BERKELEY 1ST TRIMESTER 3/8 (MISCELLANEOUS) ×2 IMPLANT
NS IRRIG 1000ML POUR BTL (IV SOLUTION) ×2 IMPLANT
PACK VAGINAL MINOR WOMEN LF (CUSTOM PROCEDURE TRAY) ×2 IMPLANT
PAD OB MATERNITY 4.3X12.25 (PERSONAL CARE ITEMS) ×2 IMPLANT
PAD PREP 24X48 CUFFED NSTRL (MISCELLANEOUS) ×2 IMPLANT
SET BERKELEY SUCTION TUBING (SUCTIONS) ×2 IMPLANT
TOWEL OR 17X24 6PK STRL BLUE (TOWEL DISPOSABLE) ×4 IMPLANT
VACURETTE 10 RIGID CVD (CANNULA) IMPLANT
VACURETTE 7MM CVD STRL WRAP (CANNULA) IMPLANT
VACURETTE 8 RIGID CVD (CANNULA) IMPLANT
VACURETTE 9 RIGID CVD (CANNULA) ×2 IMPLANT

## 2016-03-30 NOTE — Anesthesia Postprocedure Evaluation (Signed)
Anesthesia Post Note  Patient: Cale S Wirz  Procedure(s) Performed: Procedure(s) (LRB): DILATATION AND EVACUATION WITH INTRAOPERATIVE ULTRASOUND (N/A)  Patient location during evaluation: PACU Anesthesia Type: MAC Level of consciousness: awake and alert Pain management: pain level controlled Vital Signs Assessment: post-procedure vital signs reviewed and stable Respiratory status: spontaneous breathing Cardiovascular status: stable Postop Assessment: no signs of nausea or vomiting Anesthetic complications: no     Last Vitals:  Filed Vitals:   03/30/16 1345 03/30/16 1400  BP: 103/63 105/64  Pulse: 55 53  Temp:  38.1 C  Resp: 17 19    Last Pain:  Filed Vitals:   03/30/16 1411  PainSc: 2    Pain Goal: Patients Stated Pain Goal: 4 (03/30/16 1400)               Alondra Sahni

## 2016-03-30 NOTE — Interval H&P Note (Signed)
History and Physical Interval Note:  03/30/2016 11:46 AM  Anna Blackwell  has presented today for surgery, with the diagnosis of missed abortion 9 weeks  The various methods of treatment have been discussed with the patient and family. After consideration of risks, benefits and other options for treatment, the patient has consented to  Procedure(s): DILATATION AND EVACUATION (N/A) as a surgical intervention .  The patient's history has been reviewed, patient examined, no change in status, stable for surgery.  I have reviewed the patient's chart and labs.  Questions were answered to the patient's satisfaction.     Bovard-Stuckert, Jahyra Sukup

## 2016-03-30 NOTE — Anesthesia Preprocedure Evaluation (Signed)
Anesthesia Evaluation  Patient identified by MRN, date of birth, ID band Patient awake    Reviewed: Allergy & Precautions, NPO status , Patient's Chart, lab work & pertinent test results  History of Anesthesia Complications (+) PONV and history of anesthetic complications  Airway Mallampati: I  TM Distance: >3 FB Neck ROM: Full    Dental  (+) Teeth Intact   Pulmonary neg shortness of breath, neg sleep apnea, neg COPD, neg recent URI, former smoker,    breath sounds clear to auscultation       Cardiovascular negative cardio ROS   Rhythm:Regular     Neuro/Psych Anxiety negative neurological ROS     GI/Hepatic negative GI ROS, Neg liver ROS,   Endo/Other  negative endocrine ROS  Renal/GU negative Renal ROS     Musculoskeletal   Abdominal   Peds  (+) Neurological problem Hematology negative hematology ROS (+)   Anesthesia Other Findings   Reproductive/Obstetrics                             Anesthesia Physical Anesthesia Plan  ASA: II  Anesthesia Plan: MAC   Post-op Pain Management:    Induction: Intravenous  Airway Management Planned: Natural Airway, Nasal Cannula and Simple Face Mask  Additional Equipment: None  Intra-op Plan:   Post-operative Plan:   Informed Consent: I have reviewed the patients History and Physical, chart, labs and discussed the procedure including the risks, benefits and alternatives for the proposed anesthesia with the patient or authorized representative who has indicated his/her understanding and acceptance.   Dental advisory given  Plan Discussed with: CRNA and Surgeon  Anesthesia Plan Comments:         Anesthesia Quick Evaluation

## 2016-03-30 NOTE — Brief Op Note (Signed)
03/30/2016  12:43 PM  PATIENT:  Anna Blackwell  35 y.o. female  PRE-OPERATIVE DIAGNOSIS:  missed abortion 9 weeks  POST-OPERATIVE DIAGNOSIS:  MISSED ABORTION  PROCEDURE:  Procedure(s): DILATATION AND EVACUATION WITH INTRAOPERATIVE ULTRASOUND (N/A)  SURGEON:  Surgeon(s) and Role:    * Sherian ReinJody Bovard-Stuckert, MD - Primary  ANESTHESIA:   general by MAC  EBL:  Total I/O In: 1000 [I.V.:1000] Out: 155 [Urine:55; Blood:100]  BLOOD ADMINISTERED:none  DRAINS: none   LOCAL MEDICATIONS USED:  LIDOCAINE   SPECIMEN:  Source of Specimen:  POC  DISPOSITION OF SPECIMEN:  PATHOLOGY  COUNTS:  YES  TOURNIQUET:  * No tourniquets in log *  DICTATION: .Other Dictation: Dictation Number no # given  PLAN OF CARE: Discharge to home after PACU  PATIENT DISPOSITION:  PACU - hemodynamically stable.   Delay start of Pharmacological VTE agent (>24hrs) due to surgical blood loss or risk of bleeding: not applicable

## 2016-03-30 NOTE — Discharge Instructions (Addendum)
DISCHARGE INSTRUCTIONS: D&C / D&E The following instructions have been prepared to help you care for yourself upon your return home.   Personal hygiene:  Use sanitary pads for vaginal drainage, not tampons.  Shower the day after your procedure.  NO tub baths, pools or Jacuzzis for 2-3 weeks.  Wipe front to back after using the bathroom.  Activity and limitations:  Do NOT drive or operate any equipment for 24 hours. The effects of anesthesia are still present and drowsiness may result.  Do NOT rest in bed all day.  Walking is encouraged.  Walk up and down stairs slowly.  You may resume your normal activity in one to two days or as indicated by your physician.  Sexual activity: NO intercourse for at least 2 weeks after the procedure, or as indicated by your physician.  Diet: Eat a light meal as desired this evening. You may resume your usual diet tomorrow.  Return to work: You may resume your work activities in one to two days or as indicated by your doctor.  What to expect after your surgery: Expect to have vaginal bleeding/discharge for 2-3 days and spotting for up to 10 days. It is not unusual to have soreness for up to 1-2 weeks. You may have a slight burning sensation when you urinate for the first day. Mild cramps may continue for a couple of days. You may have a regular period in 2-6 weeks.  Call your doctor for any of the following:  Excessive vaginal bleeding, saturating and changing one pad every hour.  Inability to urinate 6 hours after discharge from hospital.  Pain not relieved by pain medication.  Fever of 100.4 F or greater.  Unusual vaginal discharge or odor.   Call for an appointment:    Patients signature: ______________________  Nurses signature ________________________  Support person's signature_______________________    Post Anesthesia Home Care Instructions  No ibuprofen products until: 5:45 pm today  Activity: Get plenty of rest  for the remainder of the day. A responsible adult should stay with you for 24 hours following the procedure.  For the next 24 hours, DO NOT: -Drive a car -Advertising copywriterperate machinery -Drink alcoholic beverages -Take any medication unless instructed by your physician -Make any legal decisions or sign important papers.  Meals: Start with liquid foods such as gelatin or soup. Progress to regular foods as tolerated. Avoid greasy, spicy, heavy foods. If nausea and/or vomiting occur, drink only clear liquids until the nausea and/or vomiting subsides. Call your physician if vomiting continues.  Special Instructions/Symptoms: Your throat may feel dry or sore from the anesthesia or the breathing tube placed in your throat during surgery. If this causes discomfort, gargle with warm salt water. The discomfort should disappear within 24 hours.  If you had a scopolamine patch placed behind your ear for the management of post- operative nausea and/or vomiting:  1. The medication in the patch is effective for 72 hours, after which it should be removed.  Wrap patch in a tissue and discard in the trash. Wash hands thoroughly with soap and water. 2. You may remove the patch earlier than 72 hours if you experience unpleasant side effects which may include dry mouth, dizziness or visual disturbances. 3. Avoid touching the patch. Wash your hands with soap and water after contact with the patch.

## 2016-03-30 NOTE — Transfer of Care (Signed)
Immediate Anesthesia Transfer of Care Note  Patient: Anna Blackwell  Procedure(s) Performed: Procedure(s): DILATATION AND EVACUATION WITH INTRAOPERATIVE ULTRASOUND (N/A)  Patient Location: PACU  Anesthesia Type:MAC  Level of Consciousness: awake, alert  and oriented  Airway & Oxygen Therapy: Patient Spontanous Breathing  Post-op Assessment: Report given to RN and Post -op Vital signs reviewed and stable  Post vital signs: Reviewed and stable  Last Vitals:  Filed Vitals:   03/30/16 1134  BP: 122/73  Pulse: 67  Temp: 36.9 C  Resp: 18    Last Pain: There were no vitals filed for this visit.    Patients Stated Pain Goal: 4 (03/30/16 1134)  Complications: No apparent anesthesia complications

## 2016-03-31 LAB — RH IG WORKUP (INCLUDES ABO/RH)
ABO/RH(D): B NEG
GESTATIONAL AGE(WKS): 13
UNIT DIVISION: 0

## 2016-03-31 NOTE — Op Note (Signed)
[name]:  Blackwell, Anna           ACCOUNT NO.:  [id number]  MEDICAL RECORD NO.:  [id number]  LOCATION:  [facility]                          [facility]  PHYSICIAN:  Sherron MondayJody Bovard, MD        DATE OF BIRTH:  March 07, 1981  DATE OF PROCEDURE:  03/30/2016 DATE OF DISCHARGE:  03/30/2016                              OPERATIVE REPORT   PREOPERATIVE DIAGNOSIS:  Missed abortion at 9 weeks, failed Cytotec induction.  POSTOPERATIVE DIAGNOSIS:  Missed abortion at 9 weeks, failed Cytotec induction.  PROCEDURE:  DUB with intraoperative ultrasound.  SURGEON:  Sherron MondayJody Bovard, MD  ASSISTANT:  None.  ANESTHESIA:  General by MAC.  IV FLUIDS:  1000 mL.  URINE OUTPUT:  55 mL by I and O cath.  ESTIMATED BLOOD LOSS:  Approximately 100 mL.  COMPLICATIONS:  None.  PATHOLOGY:  Products of conception to path.  PROCEDURE IN DETAIL:  After informed consent was reviewed, she was transported to the operating room, placed on the table in supine position.  She was then placed in the YelloFin stirrups.  General anesthesia was induced and found to be adequate.  She was then prepped and draped in the normal sterile fashion.  Her bladder was sterilely drained.  Using an open-sided speculum, her cervix was easily visualized at approximately 12 o'clock, 4 mL of 2% lidocaine was placed so that tenaculum could be placed and 6 mL about the right and left at approximately 5 and 7 o'clock were placed for a paracervical block.  She was then sounded to approximately 13 cm.  Her cervix was dilated to accommodate a 9-French curette.  Several passes with the curettage were performed with minimal products of conception.  Therefore, ultrasound was called to the bedside with ultrasound guidance.  There were several more passes at the fundus of the uterus revealing more tissue as well as using a Lounsbury curette and graspers.  Her uterus was evacuated.  Ultrasound appeared clear.  She was awake and in stable condition after  the instrument had been removed from her vagina.     Sherron MondayJody Bovard, MD     JB/MEDQ  D:  03/30/2016  T:  03/31/2016  Job:  [id number]

## 2016-04-01 ENCOUNTER — Encounter (HOSPITAL_COMMUNITY): Payer: Self-pay | Admitting: Obstetrics and Gynecology

## 2016-06-01 DIAGNOSIS — N939 Abnormal uterine and vaginal bleeding, unspecified: Secondary | ICD-10-CM | POA: Diagnosis not present

## 2016-06-01 DIAGNOSIS — O034 Incomplete spontaneous abortion without complication: Secondary | ICD-10-CM | POA: Diagnosis not present

## 2016-07-06 DIAGNOSIS — N939 Abnormal uterine and vaginal bleeding, unspecified: Secondary | ICD-10-CM | POA: Diagnosis not present

## 2016-07-30 DIAGNOSIS — N938 Other specified abnormal uterine and vaginal bleeding: Secondary | ICD-10-CM | POA: Diagnosis not present

## 2016-08-03 DIAGNOSIS — N9489 Other specified conditions associated with female genital organs and menstrual cycle: Secondary | ICD-10-CM | POA: Diagnosis not present

## 2016-08-07 ENCOUNTER — Encounter (HOSPITAL_COMMUNITY): Payer: Self-pay | Admitting: *Deleted

## 2016-08-10 ENCOUNTER — Other Ambulatory Visit: Payer: Self-pay | Admitting: Obstetrics and Gynecology

## 2016-08-10 DIAGNOSIS — N939 Abnormal uterine and vaginal bleeding, unspecified: Secondary | ICD-10-CM | POA: Diagnosis not present

## 2016-08-11 MED ORDER — CEFAZOLIN SODIUM-DEXTROSE 2-4 GM/100ML-% IV SOLN
2.0000 g | INTRAVENOUS | Status: AC
  Administered 2016-08-12: 2 g via INTRAVENOUS

## 2016-08-11 NOTE — H&P (Signed)
[name]:  [name], [name]           ACCOUNT NO.:  [id number]  MEDICAL RECORD NO.:  [id number]  LOCATION:  [facility]                         [facility]  [facility]  PHYSICIAN:  [name], M.D.DATE OF BIRTH:  [birth year]  DATE OF ADMISSION:  08/12/2016 DATE OF DISCHARGE:                             HISTORY & PHYSICAL   PREOPERATIVE DIAGNOSIS:  Abnormal uterine bleeding.  HISTORY OF PRESENT ILLNESS:  A 35 year old white female, G2, P1, status post missed AB in 2017.  The patient was initially diagnosed with a missed AB, was given Cytotec with failure to pass tissue, then subsequently had an ultrasound-guided D and E [facility] which was questionably, told to be significant for some possibility of retained tissue.  She continued to have irregular bleeding subsequent to her D and E, at which point she was given progesterone without success. She presented to __________ OB/GYN for a 2nd opinion at which point, she had an ultrasound revealing a thickening at the fundal area of the uterus.  Sonohysterogram confirmed a tissue like mass at the fundus consistent with possible retained tissue.  She presents now for surgical intervention to remove this tissue and is scheduled for hysteroscopy with resection.  ALLERGIES:  She has no known drug allergies.  MEDICATIONS:  No medications.  She has a history of 1 uncomplicated pregnancy and missed AB as noted.  SURGICAL HISTORY:  Remarkable for wisdom tooth extraction.  FAMILY HISTORY:  Remarkable for Alzheimer disease, ovarian cancer, brain cancer, breast cancer, hypertension, and prostate cancer.  SOCIAL HISTORY:  She is nonsmoker, nondrinker.  She denies domestic or physical violence.  PHYSICAL EXAM:  GENERAL:  Well-developed well-nourished white female, in no acute distress. HEENT:  Normal. NECK:  Supple.  Full range of motion. LUNGS:  Clear. HEART:  Regular rate and rhythm. ABDOMEN:  Soft, nontender. PELVIC:  Reveals uterus to be  anteflexed.  No adnexal masses. EXTREMITIES:  There are no cords. NEUROLOGIC:  Nonfocal. SKIN:  Intact.  IMPRESSION:  Abnormal uterine bleeding.  Persistence status post unsuccessful D and E.  PLAN:  Proceed with diagnostic hysteroscopy, possible MyoSure, possible resectoscope of endometrial mass.  Risks of anesthesia, infection, bleeding, injury to surrounding organs, possible need for repair was discussed, delayed versus immediate complications to include bowel and bladder injury noted.  The patient acknowledges and wishes to proceed.     Lenoard Adenichard J. Lot Medford, M.D.     [name]  D:  08/11/2016  T:  08/11/2016  Job:  [id number]

## 2016-08-12 ENCOUNTER — Encounter (HOSPITAL_COMMUNITY)
Admission: RE | Disposition: A | Discharge status: Home / Self Care | Payer: Self-pay | Source: Ambulatory Visit | Attending: Obstetrics and Gynecology

## 2016-08-12 ENCOUNTER — Ambulatory Visit (HOSPITAL_COMMUNITY): Payer: BLUE CROSS/BLUE SHIELD | Admitting: Certified Registered Nurse Anesthetist

## 2016-08-12 ENCOUNTER — Ambulatory Visit (HOSPITAL_COMMUNITY)
Admission: RE | Admit: 2016-08-12 | Discharge: 2016-08-12 | Disposition: A | Discharge status: Home / Self Care | Payer: BLUE CROSS/BLUE SHIELD | Source: Ambulatory Visit | Attending: Obstetrics and Gynecology | Admitting: Obstetrics and Gynecology

## 2016-08-12 ENCOUNTER — Encounter (HOSPITAL_COMMUNITY): Payer: Self-pay

## 2016-08-12 DIAGNOSIS — N858 Other specified noninflammatory disorders of uterus: Secondary | ICD-10-CM | POA: Diagnosis not present

## 2016-08-12 DIAGNOSIS — K219 Gastro-esophageal reflux disease without esophagitis: Secondary | ICD-10-CM | POA: Diagnosis not present

## 2016-08-12 DIAGNOSIS — Z87891 Personal history of nicotine dependence: Secondary | ICD-10-CM | POA: Insufficient documentation

## 2016-08-12 DIAGNOSIS — O031 Delayed or excessive hemorrhage following incomplete spontaneous abortion: Secondary | ICD-10-CM | POA: Diagnosis not present

## 2016-08-12 DIAGNOSIS — N939 Abnormal uterine and vaginal bleeding, unspecified: Secondary | ICD-10-CM | POA: Diagnosis not present

## 2016-08-12 DIAGNOSIS — F419 Anxiety disorder, unspecified: Secondary | ICD-10-CM | POA: Diagnosis not present

## 2016-08-12 HISTORY — PX: DILATATION & CURETTAGE/HYSTEROSCOPY WITH MYOSURE: SHX6511

## 2016-08-12 HISTORY — DX: Gastro-esophageal reflux disease without esophagitis: K21.9

## 2016-08-12 LAB — CBC
HEMATOCRIT: 44.8 % (ref 36.0–46.0)
HEMOGLOBIN: 15.2 g/dL — AB (ref 12.0–15.0)
MCH: 30.8 pg (ref 26.0–34.0)
MCHC: 33.9 g/dL (ref 30.0–36.0)
MCV: 90.9 fL (ref 78.0–100.0)
Platelets: 172 10*3/uL (ref 150–400)
RBC: 4.93 MIL/uL (ref 3.87–5.11)
RDW: 12.9 % (ref 11.5–15.5)
WBC: 7.6 10*3/uL (ref 4.0–10.5)

## 2016-08-12 SURGERY — DILATATION & CURETTAGE/HYSTEROSCOPY WITH MYOSURE
Anesthesia: General | Site: Vagina

## 2016-08-12 MED ORDER — PROPOFOL 10 MG/ML IV BOLUS
INTRAVENOUS | Status: AC
  Filled 2016-08-12: qty 20

## 2016-08-12 MED ORDER — FENTANYL CITRATE (PF) 100 MCG/2ML IJ SOLN
INTRAMUSCULAR | Status: AC
  Filled 2016-08-12: qty 2

## 2016-08-12 MED ORDER — LIDOCAINE HCL (CARDIAC) 20 MG/ML IV SOLN
INTRAVENOUS | Status: DC | PRN
  Administered 2016-08-12: 60 mg via INTRAVENOUS

## 2016-08-12 MED ORDER — ONDANSETRON HCL 4 MG/2ML IJ SOLN
INTRAMUSCULAR | Status: DC | PRN
  Administered 2016-08-12: 4 mg via INTRAVENOUS

## 2016-08-12 MED ORDER — MIDAZOLAM HCL 2 MG/2ML IJ SOLN
INTRAMUSCULAR | Status: AC
  Filled 2016-08-12: qty 2

## 2016-08-12 MED ORDER — DEXAMETHASONE SODIUM PHOSPHATE 4 MG/ML IJ SOLN
INTRAMUSCULAR | Status: AC
  Filled 2016-08-12: qty 1

## 2016-08-12 MED ORDER — ONDANSETRON HCL 4 MG/2ML IJ SOLN
INTRAMUSCULAR | Status: AC
  Filled 2016-08-12: qty 2

## 2016-08-12 MED ORDER — ACETAMINOPHEN 160 MG/5ML PO SOLN
975.0000 mg | Freq: Once | ORAL | Status: AC
  Administered 2016-08-12: 975 mg via ORAL

## 2016-08-12 MED ORDER — TRAMADOL HCL 50 MG PO TABS: 50.0000 mg | ORAL_TABLET | Freq: Four times a day (QID) | ORAL | 0 refills | Status: DC | PRN

## 2016-08-12 MED ORDER — SCOPOLAMINE 1 MG/3DAYS TD PT72
1.0000 | MEDICATED_PATCH | Freq: Once | TRANSDERMAL | Status: DC
  Administered 2016-08-12: 1.5 mg via TRANSDERMAL

## 2016-08-12 MED ORDER — LIDOCAINE HCL (CARDIAC) 20 MG/ML IV SOLN
INTRAVENOUS | Status: AC
  Filled 2016-08-12: qty 5

## 2016-08-12 MED ORDER — KETOROLAC TROMETHAMINE 30 MG/ML IJ SOLN
INTRAMUSCULAR | Status: AC
  Filled 2016-08-12: qty 1

## 2016-08-12 MED ORDER — PROPOFOL 10 MG/ML IV BOLUS
INTRAVENOUS | Status: DC | PRN
  Administered 2016-08-12: 30 mg via INTRAVENOUS
  Administered 2016-08-12: 170 mg via INTRAVENOUS

## 2016-08-12 MED ORDER — DEXAMETHASONE SODIUM PHOSPHATE 4 MG/ML IJ SOLN
INTRAMUSCULAR | Status: DC | PRN
  Administered 2016-08-12: 4 mg via INTRAVENOUS

## 2016-08-12 MED ORDER — LACTATED RINGERS IV SOLN
INTRAVENOUS | Status: DC
  Administered 2016-08-12: 125 mL/h via INTRAVENOUS
  Administered 2016-08-12: 13:00:00 via INTRAVENOUS

## 2016-08-12 MED ORDER — VASOPRESSIN 20 UNIT/ML IV SOLN
INTRAVENOUS | Status: AC
  Filled 2016-08-12: qty 1

## 2016-08-12 MED ORDER — SCOPOLAMINE 1 MG/3DAYS TD PT72
MEDICATED_PATCH | TRANSDERMAL | Status: AC
  Administered 2016-08-12: 1.5 mg via TRANSDERMAL
  Filled 2016-08-12: qty 1

## 2016-08-12 MED ORDER — MIDAZOLAM HCL 5 MG/5ML IJ SOLN
INTRAMUSCULAR | Status: DC | PRN
  Administered 2016-08-12: 2 mg via INTRAVENOUS

## 2016-08-12 MED ORDER — VASOPRESSIN 20 UNIT/ML IV SOLN
INTRAVENOUS | Status: DC | PRN
  Administered 2016-08-12: 20 [IU]

## 2016-08-12 MED ORDER — SODIUM CHLORIDE 0.9 % IR SOLN
Status: DC | PRN
  Administered 2016-08-12: 3000 mL

## 2016-08-12 MED ORDER — BUPIVACAINE HCL (PF) 0.25 % IJ SOLN
INTRAMUSCULAR | Status: DC | PRN
  Administered 2016-08-12: 20 mL

## 2016-08-12 MED ORDER — BUPIVACAINE HCL (PF) 0.25 % IJ SOLN
INTRAMUSCULAR | Status: AC
  Filled 2016-08-12: qty 30

## 2016-08-12 MED ORDER — KETOROLAC TROMETHAMINE 30 MG/ML IJ SOLN
INTRAMUSCULAR | Status: DC | PRN
  Administered 2016-08-12: 30 mg via INTRAVENOUS

## 2016-08-12 MED ORDER — SODIUM CHLORIDE 0.9 % IJ SOLN
INTRAMUSCULAR | Status: AC
  Filled 2016-08-12: qty 50

## 2016-08-12 MED ORDER — FENTANYL CITRATE (PF) 100 MCG/2ML IJ SOLN
INTRAMUSCULAR | Status: DC | PRN
  Administered 2016-08-12 (×2): 50 ug via INTRAVENOUS

## 2016-08-12 SURGICAL SUPPLY — 19 items
CANISTER SUCT 3000ML (MISCELLANEOUS) ×2 IMPLANT
CATH ROBINSON RED A/P 16FR (CATHETERS) ×2 IMPLANT
CLOTH BEACON ORANGE TIMEOUT ST (SAFETY) ×2 IMPLANT
CONTAINER PREFILL 10% NBF 60ML (FORM) ×4 IMPLANT
DEVICE MYOSURE LITE (MISCELLANEOUS) IMPLANT
DEVICE MYOSURE REACH (MISCELLANEOUS) ×2 IMPLANT
FILTER ARTHROSCOPY CONVERTOR (FILTER) ×2 IMPLANT
GLOVE BIO SURGEON STRL SZ7.5 (GLOVE) ×2 IMPLANT
GLOVE BIOGEL PI IND STRL 7.0 (GLOVE) ×1 IMPLANT
GLOVE BIOGEL PI INDICATOR 7.0 (GLOVE) ×1
GOWN STRL REUS W/TWL LRG LVL3 (GOWN DISPOSABLE) ×6 IMPLANT
PACK VAGINAL MINOR WOMEN LF (CUSTOM PROCEDURE TRAY) ×2 IMPLANT
PAD OB MATERNITY 4.3X12.25 (PERSONAL CARE ITEMS) ×2 IMPLANT
SEAL ROD LENS SCOPE MYOSURE (ABLATOR) ×2 IMPLANT
SYR TB 1ML 25GX5/8 (SYRINGE) ×2 IMPLANT
TOWEL OR 17X24 6PK STRL BLUE (TOWEL DISPOSABLE) ×4 IMPLANT
TUBING AQUILEX INFLOW (TUBING) ×2 IMPLANT
TUBING AQUILEX OUTFLOW (TUBING) ×2 IMPLANT
WATER STERILE IRR 1000ML POUR (IV SOLUTION) ×2 IMPLANT

## 2016-08-12 NOTE — Anesthesia Preprocedure Evaluation (Signed)
Anesthesia Evaluation  Patient identified by MRN, date of birth, ID band Patient awake    Reviewed: Allergy & Precautions, NPO status , Patient's Chart, lab work & pertinent test results  History of Anesthesia Complications (+) PONV  Airway Mallampati: I  TM Distance: >3 FB Neck ROM: Full    Dental   Pulmonary former smoker,    Pulmonary exam normal        Cardiovascular Normal cardiovascular exam     Neuro/Psych Anxiety    GI/Hepatic GERD  Medicated and Controlled,  Endo/Other    Renal/GU      Musculoskeletal   Abdominal   Peds  Hematology   Anesthesia Other Findings   Reproductive/Obstetrics                             Anesthesia Physical Anesthesia Plan  ASA: II  Anesthesia Plan: General   Post-op Pain Management:    Induction: Intravenous  Airway Management Planned: LMA  Additional Equipment:   Intra-op Plan:   Post-operative Plan: Extubation in OR  Informed Consent: I have reviewed the patients History and Physical, chart, labs and discussed the procedure including the risks, benefits and alternatives for the proposed anesthesia with the patient or authorized representative who has indicated his/her understanding and acceptance.     Plan Discussed with: CRNA and Surgeon  Anesthesia Plan Comments:         Anesthesia Quick Evaluation

## 2016-08-12 NOTE — Progress Notes (Signed)
Patient seen and examined. Consent witnessed and signed. No changes noted. Update completed.Patient ID: Anna GlassChanteal S Blackwell, female   DOB: 1981/08/19, 35 y.o.   MRN: [id number]

## 2016-08-12 NOTE — Anesthesia Procedure Notes (Signed)
Procedure Name: LMA Insertion Date/Time: 08/12/2016 12:09 PM Performed by: Yolonda KidaARVER, Justn Quale L Pre-anesthesia Checklist: Patient identified, Emergency Drugs available, Suction available and Patient being monitored Patient Re-evaluated:Patient Re-evaluated prior to inductionOxygen Delivery Method: Circle system utilized Preoxygenation: Pre-oxygenation with 100% oxygen Intubation Type: IV induction Ventilation: Mask ventilation without difficulty LMA: LMA inserted LMA Size: 4.0 Number of attempts: 1 Placement Confirmation: positive ETCO2,  CO2 detector and breath sounds checked- equal and bilateral Tube secured with: Tape Dental Injury: Teeth and Oropharynx as per pre-operative assessment

## 2016-08-12 NOTE — Anesthesia Postprocedure Evaluation (Signed)
Anesthesia Post Note  Patient: Anna Blackwell  Procedure(s) Performed: Procedure(s) (LRB): DILATATION & CURETTAGE/HYSTEROSCOPY WITH MYOSURE (N/A)  Anesthesia Post Evaluation   Last Vitals:  Vitals:   08/12/16 1315 08/12/16 1330  BP: 105/75 107/71  Pulse: (!) 57 (!) 52  Resp: 18 19  Temp:      Last Pain:  Vitals:   08/12/16 1330  TempSrc:   PainSc: 0-No pain   Pain Goal: Patients Stated Pain Goal: 5 (08/12/16 1330)               Gagan Dillion DAVID

## 2016-08-12 NOTE — Transfer of Care (Signed)
Immediate Anesthesia Transfer of Care Note  Patient: Anna Blackwell  Procedure(s) Performed: Procedure(s): DILATATION & CURETTAGE/HYSTEROSCOPY WITH MYOSURE (N/A)  Patient Location: PACU  Anesthesia Type:General  Level of Consciousness: awake, alert , oriented and patient cooperative  Airway & Oxygen Therapy: Patient Spontanous Breathing and Patient connected to nasal cannula oxygen  Post-op Assessment: Report given to RN and Post -op Vital signs reviewed and stable  Post vital signs: Reviewed and stable  Last Vitals:  Vitals:   08/12/16 1130  BP: 132/82  Pulse: 84  Resp: 16  Temp: 37 C    Last Pain:  Vitals:   08/12/16 1130  TempSrc: Oral      Patients Stated Pain Goal: 5 (08/12/16 1130)  Complications: No apparent anesthesia complications

## 2016-08-12 NOTE — Discharge Instructions (Signed)
DISCHARGE INSTRUCTIONS: D&C / D&E The following instructions have been prepared to help you care for yourself upon your return home.   Personal hygiene:  Use sanitary pads for vaginal drainage, not tampons.  Shower the day after your procedure.  NO tub baths, pools or Jacuzzis for 2-3 weeks.  Wipe front to back after using the bathroom.  Activity and limitations:  Do NOT drive or operate any equipment for 24 hours. The effects of anesthesia are still present and drowsiness may result.  Do NOT rest in bed all day.  Walking is encouraged.  Walk up and down stairs slowly.  You may resume your normal activity in one to two days or as indicated by your physician.  Sexual activity: NO intercourse for at least 2 weeks after the procedure, or as indicated by your physician.  Diet: Eat a light meal as desired this evening. You may resume your usual diet tomorrow.  Return to work: You may resume your work activities in one to two days or as indicated by your doctor.  What to expect after your surgery: Expect to have vaginal bleeding/discharge for 2-3 days and spotting for up to 10 days. It is not unusual to have soreness for up to 1-2 weeks. You may have a slight burning sensation when you urinate for the first day. Mild cramps may continue for a couple of days. You may have a regular period in 2-6 weeks.  Call your doctor for any of the following:  Excessive vaginal bleeding, saturating and changing one pad every hour.  Inability to urinate 6 hours after discharge from hospital.  Pain not relieved by pain medication.  Fever of 100.4 F or greater.  Unusual vaginal discharge or odor.   Call for an appointment:    Patients signature: ______________________  Nurses signature ________________________  Support person's signature_______________________    Post Anesthesia Home Care Instructions  NO IBUPROFEN PRODUCTS UNTIL: 6:30 pm today  Activity: Get plenty of rest  for the remainder of the day. A responsible adult should stay with you for 24 hours following the procedure.  For the next 24 hours, DO NOT: -Drive a car -Advertising copywriterperate machinery -Drink alcoholic beverages -Take any medication unless instructed by your physician -Make any legal decisions or sign important papers.  Meals: Start with liquid foods such as gelatin or soup. Progress to regular foods as tolerated. Avoid greasy, spicy, heavy foods. If nausea and/or vomiting occur, drink only clear liquids until the nausea and/or vomiting subsides. Call your physician if vomiting continues.  Special Instructions/Symptoms: Your throat may feel dry or sore from the anesthesia or the breathing tube placed in your throat during surgery. If this causes discomfort, gargle with warm salt water. The discomfort should disappear within 24 hours.  If you had a scopolamine patch placed behind your ear for the management of post- operative nausea and/or vomiting:  1. The medication in the patch is effective for 72 hours, after which it should be removed.  Wrap patch in a tissue and discard in the trash. Wash hands thoroughly with soap and water. 2. You may remove the patch earlier than 72 hours if you experience unpleasant side effects which may include dry mouth, dizziness or visual disturbances. 3. Avoid touching the patch. Wash your hands with soap and water after contact with the patch.

## 2016-08-12 NOTE — Op Note (Signed)
08/12/2016  12:45 PM  PATIENT:  Anna Blackwell  35 y.o. female  PRE-OPERATIVE DIAGNOSIS:  abnormal uterine bleeding, endometrial mass  POST-OPERATIVE DIAGNOSIS:  abnormal uterine bleeding, endometrial mass  PROCEDURE:  Procedure(s): DIAGNOSTIC HYSTEROSCOPY WITH MYOSURE  SURGEON:  Surgeon(s): Olivia Mackieichard Leotha Westermeyer, MD  ASSISTANTS: none   ANESTHESIA:   local and general  ESTIMATED BLOOD LOSS: minimal Fluid deficit: 600cc  DRAINS: none   LOCAL MEDICATIONS USED:  MARCAINE    and Amount: 20 ml  SPECIMEN:  Source of Specimen:  endometrial mass  DISPOSITION OF SPECIMEN:  PATHOLOGY  COUNTS:  YES  DICTATION #: [id number]  PLAN OF CARE: dc home  PATIENT DISPOSITION:  PACU - hemodynamically stable.

## 2016-08-13 ENCOUNTER — Encounter (HOSPITAL_COMMUNITY): Payer: Self-pay | Admitting: Obstetrics and Gynecology

## 2016-08-13 NOTE — Op Note (Signed)
[name]:  [name], [name]           ACCOUNT NO.:  [id number]  MEDICAL RECORD NO.:  [id number]  LOCATION:  [facility]                          [facility]  PHYSICIAN:  Lenoard Adenichard J. Velera Lansdale, M.D.DATE OF BIRTH:  [birth year]  DATE OF PROCEDURE:  08/12/2016 DATE OF DISCHARGE:  08/12/2016                              OPERATIVE REPORT   PREOPERATIVE DIAGNOSIS:  Abnormal uterine bleeding with endometrial mass on sonohysterogram.  POSTOPERATIVE DIAGNOSIS:  Abnormal uterine bleeding with endometrial mass on sonohysterogram.  PROCEDURE:  Diagnostic hysteroscopy with MyoSure resection.  SURGEON:  Lenoard Adenichard J. Maitland Muhlbauer, MD.  ASSISTANT:  None.  ANESTHESIA:  General and local.  ESTIMATED BLOOD LOSS:  Less than 50 mL.  FLUID DEFICIT:  600 mL.  DISPOSITION:  The patient to recovery in good condition.  DESCRIPTION OF PROCEDURE:  After being apprised of the risks of anesthesia, infection, bleeding, injury to surrounding organs with possible need for repair, delayed versus immediate complications to include bowel and bladder injury with need for repair.  The patient was brought to the operating room.  She was administered general anesthetic without complications.  Prepped and draped in usual sterile fashion. Catheterized until the bladder was empty.  Exam under anesthesia reveals anteflexed uterus.  No adnexal masses.  Dilute Marcaine solution placed in a paracervical block, 20 mL in total.  Dilute vasopressin solution placed at 3 and 9 o'clock.  Cervix dilated with some difficulty due to some stenosis up to a #23 Pratt dilator.  Hysteroscope placed. Visualization reveals a fundal to left lateral collection of endometrial tissue which extrudes from the top of the uterus down along the left lateral border.  The MyoSure device was entered and this tissue was resected in multiple passes without difficulty.  At the end of the procedure, the endometrial cavity appears clean of any further  tissue accumulation.  Bilateral normal tubal ostia were noted.  All instruments were removed.  Fluid deficit and blood loss, as noted.  The patient tolerated the procedure well, was awakened, and transferred to recovery in good condition.     Lenoard Adenichard J. Tandy Lewin, M.D.     [name]  D:  08/12/2016  T:  08/13/2016  Job:  [id number]

## 2016-09-04 ENCOUNTER — Inpatient Hospital Stay (HOSPITAL_COMMUNITY): Admission: AD | Admit: 2016-09-04 | Payer: Self-pay | Source: Ambulatory Visit | Admitting: Obstetrics and Gynecology

## 2016-09-08 DIAGNOSIS — N938 Other specified abnormal uterine and vaginal bleeding: Secondary | ICD-10-CM | POA: Diagnosis not present

## 2016-10-23 DIAGNOSIS — N939 Abnormal uterine and vaginal bleeding, unspecified: Secondary | ICD-10-CM | POA: Diagnosis not present

## 2016-10-30 DIAGNOSIS — N913 Primary oligomenorrhea: Secondary | ICD-10-CM | POA: Diagnosis not present

## 2016-11-18 DIAGNOSIS — H01119 Allergic dermatitis of unspecified eye, unspecified eyelid: Secondary | ICD-10-CM | POA: Diagnosis not present

## 2016-11-20 DIAGNOSIS — N915 Oligomenorrhea, unspecified: Secondary | ICD-10-CM | POA: Diagnosis not present

## 2016-11-27 DIAGNOSIS — N915 Oligomenorrhea, unspecified: Secondary | ICD-10-CM | POA: Diagnosis not present

## 2017-05-19 DIAGNOSIS — Z3A01 Less than 8 weeks gestation of pregnancy: Secondary | ICD-10-CM | POA: Diagnosis not present

## 2017-05-19 DIAGNOSIS — Z3201 Encounter for pregnancy test, result positive: Secondary | ICD-10-CM | POA: Diagnosis not present

## 2017-05-19 DIAGNOSIS — O09291 Supervision of pregnancy with other poor reproductive or obstetric history, first trimester: Secondary | ICD-10-CM | POA: Diagnosis not present

## 2017-05-21 DIAGNOSIS — Z3A01 Less than 8 weeks gestation of pregnancy: Secondary | ICD-10-CM | POA: Diagnosis not present

## 2017-05-21 DIAGNOSIS — O09291 Supervision of pregnancy with other poor reproductive or obstetric history, first trimester: Secondary | ICD-10-CM | POA: Diagnosis not present

## 2017-05-25 DIAGNOSIS — O09299 Supervision of pregnancy with other poor reproductive or obstetric history, unspecified trimester: Secondary | ICD-10-CM | POA: Diagnosis not present

## 2017-05-25 DIAGNOSIS — Z3A01 Less than 8 weeks gestation of pregnancy: Secondary | ICD-10-CM | POA: Diagnosis not present

## 2017-06-15 DIAGNOSIS — Z3201 Encounter for pregnancy test, result positive: Secondary | ICD-10-CM | POA: Diagnosis not present

## 2017-06-22 DIAGNOSIS — Z3689 Encounter for other specified antenatal screening: Secondary | ICD-10-CM | POA: Diagnosis not present

## 2017-06-22 DIAGNOSIS — Z3A09 9 weeks gestation of pregnancy: Secondary | ICD-10-CM | POA: Diagnosis not present

## 2017-06-22 DIAGNOSIS — O09521 Supervision of elderly multigravida, first trimester: Secondary | ICD-10-CM | POA: Diagnosis not present

## 2017-06-22 LAB — OB RESULTS CONSOLE HIV ANTIBODY (ROUTINE TESTING): HIV: NONREACTIVE

## 2017-06-22 LAB — OB RESULTS CONSOLE RPR: RPR: NONREACTIVE

## 2017-06-22 LAB — OB RESULTS CONSOLE ABO/RH: RH Type: NEGATIVE

## 2017-06-22 LAB — OB RESULTS CONSOLE GC/CHLAMYDIA
CHLAMYDIA, DNA PROBE: NEGATIVE
GC PROBE AMP, GENITAL: NEGATIVE

## 2017-06-22 LAB — OB RESULTS CONSOLE RUBELLA ANTIBODY, IGM: Rubella: IMMUNE

## 2017-06-22 LAB — OB RESULTS CONSOLE HEPATITIS B SURFACE ANTIGEN: Hepatitis B Surface Ag: NEGATIVE

## 2017-06-22 LAB — OB RESULTS CONSOLE ANTIBODY SCREEN: ANTIBODY SCREEN: NEGATIVE

## 2017-06-30 DIAGNOSIS — Z3689 Encounter for other specified antenatal screening: Secondary | ICD-10-CM | POA: Diagnosis not present

## 2017-06-30 DIAGNOSIS — Z3A10 10 weeks gestation of pregnancy: Secondary | ICD-10-CM | POA: Diagnosis not present

## 2017-06-30 DIAGNOSIS — O09521 Supervision of elderly multigravida, first trimester: Secondary | ICD-10-CM | POA: Diagnosis not present

## 2017-07-21 DIAGNOSIS — Z3A13 13 weeks gestation of pregnancy: Secondary | ICD-10-CM | POA: Diagnosis not present

## 2017-07-21 DIAGNOSIS — O09521 Supervision of elderly multigravida, first trimester: Secondary | ICD-10-CM | POA: Diagnosis not present

## 2017-07-28 DIAGNOSIS — O26899 Other specified pregnancy related conditions, unspecified trimester: Secondary | ICD-10-CM | POA: Diagnosis not present

## 2017-07-28 DIAGNOSIS — R300 Dysuria: Secondary | ICD-10-CM | POA: Diagnosis not present

## 2017-08-09 DIAGNOSIS — Z3A16 16 weeks gestation of pregnancy: Secondary | ICD-10-CM | POA: Diagnosis not present

## 2017-08-09 DIAGNOSIS — Z361 Encounter for antenatal screening for raised alphafetoprotein level: Secondary | ICD-10-CM | POA: Diagnosis not present

## 2017-08-09 DIAGNOSIS — O09522 Supervision of elderly multigravida, second trimester: Secondary | ICD-10-CM | POA: Diagnosis not present

## 2017-08-25 DIAGNOSIS — O4402 Placenta previa specified as without hemorrhage, second trimester: Secondary | ICD-10-CM | POA: Diagnosis not present

## 2017-08-25 DIAGNOSIS — Z3A18 18 weeks gestation of pregnancy: Secondary | ICD-10-CM | POA: Diagnosis not present

## 2017-09-24 DIAGNOSIS — O4402 Placenta previa specified as without hemorrhage, second trimester: Secondary | ICD-10-CM | POA: Diagnosis not present

## 2017-09-24 DIAGNOSIS — Z3A22 22 weeks gestation of pregnancy: Secondary | ICD-10-CM | POA: Diagnosis not present

## 2017-10-19 DIAGNOSIS — Z23 Encounter for immunization: Secondary | ICD-10-CM | POA: Diagnosis not present

## 2017-10-19 DIAGNOSIS — O4402 Placenta previa specified as without hemorrhage, second trimester: Secondary | ICD-10-CM | POA: Diagnosis not present

## 2017-10-19 DIAGNOSIS — Z3A26 26 weeks gestation of pregnancy: Secondary | ICD-10-CM | POA: Diagnosis not present

## 2017-11-01 DIAGNOSIS — O4403 Placenta previa specified as without hemorrhage, third trimester: Secondary | ICD-10-CM | POA: Diagnosis not present

## 2017-11-01 DIAGNOSIS — Z3689 Encounter for other specified antenatal screening: Secondary | ICD-10-CM | POA: Diagnosis not present

## 2017-11-01 DIAGNOSIS — Z23 Encounter for immunization: Secondary | ICD-10-CM | POA: Diagnosis not present

## 2017-11-01 DIAGNOSIS — Z3A28 28 weeks gestation of pregnancy: Secondary | ICD-10-CM | POA: Diagnosis not present

## 2017-11-01 DIAGNOSIS — O360130 Maternal care for anti-D [Rh] antibodies, third trimester, not applicable or unspecified: Secondary | ICD-10-CM | POA: Diagnosis not present

## 2017-11-17 DIAGNOSIS — O4403 Placenta previa specified as without hemorrhage, third trimester: Secondary | ICD-10-CM | POA: Diagnosis not present

## 2017-11-17 DIAGNOSIS — Z3A30 30 weeks gestation of pregnancy: Secondary | ICD-10-CM | POA: Diagnosis not present

## 2017-11-19 ENCOUNTER — Other Ambulatory Visit (HOSPITAL_COMMUNITY): Payer: Self-pay | Admitting: Obstetrics and Gynecology

## 2017-11-19 DIAGNOSIS — O43213 Placenta accreta, third trimester: Secondary | ICD-10-CM

## 2017-11-19 DIAGNOSIS — Z3A31 31 weeks gestation of pregnancy: Secondary | ICD-10-CM

## 2017-11-19 DIAGNOSIS — O4403 Placenta previa specified as without hemorrhage, third trimester: Secondary | ICD-10-CM

## 2017-11-19 DIAGNOSIS — Z3689 Encounter for other specified antenatal screening: Secondary | ICD-10-CM

## 2017-11-23 ENCOUNTER — Encounter (HOSPITAL_COMMUNITY): Payer: Self-pay | Admitting: *Deleted

## 2017-11-24 ENCOUNTER — Other Ambulatory Visit (HOSPITAL_COMMUNITY): Payer: Self-pay | Admitting: Obstetrics and Gynecology

## 2017-11-24 ENCOUNTER — Encounter (HOSPITAL_COMMUNITY): Payer: Self-pay

## 2017-11-24 ENCOUNTER — Ambulatory Visit (HOSPITAL_COMMUNITY)
Admission: RE | Admit: 2017-11-24 | Discharge: 2017-11-24 | Disposition: A | Discharge status: Home / Self Care | Payer: BLUE CROSS/BLUE SHIELD | Source: Ambulatory Visit | Attending: Obstetrics and Gynecology | Admitting: Obstetrics and Gynecology

## 2017-11-24 DIAGNOSIS — Z3A31 31 weeks gestation of pregnancy: Secondary | ICD-10-CM

## 2017-11-24 DIAGNOSIS — O4403 Placenta previa specified as without hemorrhage, third trimester: Secondary | ICD-10-CM | POA: Diagnosis not present

## 2017-11-24 DIAGNOSIS — Z3689 Encounter for other specified antenatal screening: Secondary | ICD-10-CM

## 2017-11-24 DIAGNOSIS — O43213 Placenta accreta, third trimester: Secondary | ICD-10-CM

## 2017-11-24 DIAGNOSIS — O4423 Partial placenta previa NOS or without hemorrhage, third trimester: Secondary | ICD-10-CM | POA: Diagnosis not present

## 2017-11-24 HISTORY — DX: Other cervical disc displacement, unspecified cervical region: M50.20

## 2017-11-25 ENCOUNTER — Other Ambulatory Visit (HOSPITAL_COMMUNITY): Payer: Self-pay | Admitting: *Deleted

## 2017-11-25 DIAGNOSIS — O09523 Supervision of elderly multigravida, third trimester: Secondary | ICD-10-CM

## 2017-11-25 DIAGNOSIS — O4420 Partial placenta previa NOS or without hemorrhage, unspecified trimester: Secondary | ICD-10-CM

## 2017-12-08 DIAGNOSIS — O4403 Placenta previa specified as without hemorrhage, third trimester: Secondary | ICD-10-CM | POA: Diagnosis not present

## 2017-12-08 DIAGNOSIS — L989 Disorder of the skin and subcutaneous tissue, unspecified: Secondary | ICD-10-CM | POA: Diagnosis not present

## 2017-12-08 DIAGNOSIS — D225 Melanocytic nevi of trunk: Secondary | ICD-10-CM | POA: Diagnosis not present

## 2017-12-08 DIAGNOSIS — Z3A30 30 weeks gestation of pregnancy: Secondary | ICD-10-CM | POA: Diagnosis not present

## 2017-12-22 ENCOUNTER — Encounter (HOSPITAL_COMMUNITY): Payer: Self-pay

## 2017-12-22 ENCOUNTER — Ambulatory Visit (HOSPITAL_COMMUNITY): Payer: BLUE CROSS/BLUE SHIELD

## 2017-12-22 DIAGNOSIS — Z3685 Encounter for antenatal screening for Streptococcus B: Secondary | ICD-10-CM | POA: Diagnosis not present

## 2017-12-22 DIAGNOSIS — O4403 Placenta previa specified as without hemorrhage, third trimester: Secondary | ICD-10-CM | POA: Diagnosis not present

## 2017-12-22 DIAGNOSIS — Z3A35 35 weeks gestation of pregnancy: Secondary | ICD-10-CM | POA: Diagnosis not present

## 2017-12-28 ENCOUNTER — Other Ambulatory Visit: Payer: Self-pay | Admitting: Obstetrics and Gynecology

## 2017-12-28 ENCOUNTER — Encounter (HOSPITAL_COMMUNITY): Payer: Self-pay

## 2017-12-29 DIAGNOSIS — Z3A36 36 weeks gestation of pregnancy: Secondary | ICD-10-CM | POA: Diagnosis not present

## 2017-12-29 DIAGNOSIS — O4403 Placenta previa specified as without hemorrhage, third trimester: Secondary | ICD-10-CM | POA: Diagnosis not present

## 2017-12-30 ENCOUNTER — Encounter (HOSPITAL_COMMUNITY): Payer: Self-pay

## 2018-01-05 DIAGNOSIS — O4403 Placenta previa specified as without hemorrhage, third trimester: Secondary | ICD-10-CM | POA: Diagnosis not present

## 2018-01-05 DIAGNOSIS — J029 Acute pharyngitis, unspecified: Secondary | ICD-10-CM | POA: Diagnosis not present

## 2018-01-05 DIAGNOSIS — Z3A37 37 weeks gestation of pregnancy: Secondary | ICD-10-CM | POA: Diagnosis not present

## 2018-01-07 ENCOUNTER — Encounter (HOSPITAL_COMMUNITY): Admission: RE | Admit: 2018-01-07 | Payer: BLUE CROSS/BLUE SHIELD | Source: Ambulatory Visit

## 2018-01-10 ENCOUNTER — Encounter (HOSPITAL_COMMUNITY)
Admission: RE | Admit: 2018-01-10 | Discharge: 2018-01-10 | Disposition: A | Discharge status: Home / Self Care | Payer: BLUE CROSS/BLUE SHIELD | Source: Ambulatory Visit | Attending: Obstetrics and Gynecology | Admitting: Obstetrics and Gynecology

## 2018-01-10 DIAGNOSIS — Z23 Encounter for immunization: Secondary | ICD-10-CM | POA: Diagnosis not present

## 2018-01-10 DIAGNOSIS — L231 Allergic contact dermatitis due to adhesives: Secondary | ICD-10-CM | POA: Diagnosis not present

## 2018-01-10 DIAGNOSIS — O4423 Partial placenta previa NOS or without hemorrhage, third trimester: Secondary | ICD-10-CM | POA: Diagnosis not present

## 2018-01-10 DIAGNOSIS — O4403 Placenta previa specified as without hemorrhage, third trimester: Secondary | ICD-10-CM | POA: Diagnosis not present

## 2018-01-10 DIAGNOSIS — Z3A38 38 weeks gestation of pregnancy: Secondary | ICD-10-CM | POA: Diagnosis not present

## 2018-01-10 DIAGNOSIS — Z87891 Personal history of nicotine dependence: Secondary | ICD-10-CM | POA: Diagnosis not present

## 2018-01-10 DIAGNOSIS — Z412 Encounter for routine and ritual male circumcision: Secondary | ICD-10-CM | POA: Diagnosis not present

## 2018-01-10 LAB — CBC
HCT: 38.3 % (ref 36.0–46.0)
Hemoglobin: 13 g/dL (ref 12.0–15.0)
MCH: 31.6 pg (ref 26.0–34.0)
MCHC: 33.9 g/dL (ref 30.0–36.0)
MCV: 93.2 fL (ref 78.0–100.0)
PLATELETS: 134 10*3/uL — AB (ref 150–400)
RBC: 4.11 MIL/uL (ref 3.87–5.11)
RDW: 13.4 % (ref 11.5–15.5)
WBC: 8.8 10*3/uL (ref 4.0–10.5)

## 2018-01-10 NOTE — Patient Instructions (Signed)
Anna Blackwell  01/10/2018   Your procedure is scheduled on:  01/11/2018  Enter through the [facility]tr[facility] of [facility] at 1100 AM.  Pick up the phone at the desk and dial [phone]  Call this number if you have problems the morning of surgery:[phone]  Remember:   Do not eat food:(After Midnight) Desps de medianoche.  Do not drink clear liquids: (After Midnight) Desps de medianoche.  Take these medicines the morning of surgery with A SIP OF WATER: none   Do not wear jewelry, make-up or nail polish.  Do not wear lotions, powders, or perfumes. Do not wear deodorant.  Do not shave 48 hours prior to surgery.  Do not bring valuables to the hospital.  [facility] is not   responsible for any belongings or valuables brought to the hospital.  Contacts, dentures or bridgework may not be worn into surgery.  Leave suitcase in the car. After surgery it may be brought to your room.  For patients admitted to the hospital, checkout time is 11:00 AM the day of              discharge.    N/A   Please read over the following fact sheets that you were given:   Surgical Site Infection Prevention

## 2018-01-10 NOTE — H&P (Signed)
Anna Blackwell is a 37 y.o. female presenting for primary csection due to marginal previa.Pt requests to wait until 38 weeks. Sono done last week to confirm placental location.  OB History    Gravida  3   Para  1   Term  1   Preterm      AB  1   Living  1     SAB  1   TAB      Ectopic      Multiple      Live Births  1          Past Medical History:  Diagnosis Date  . Anxiety   . GERD (gastroesophageal reflux disease)    occasionally with milk products  . Missed abortion 03/28/2016  . No pertinent past medical history   . PONV (postoperative nausea and vomiting)   . Ruptured cervical disc   . S/P dilatation and curettage 03/30/2016  . Vaginal delivery 2012   Past Surgical History:  Procedure Laterality Date  . DILATATION & CURETTAGE/HYSTEROSCOPY WITH MYOSURE N/A 08/12/2016   Procedure: DILATATION & CURETTAGE/HYSTEROSCOPY WITH MYOSURE;  Surgeon: Olivia Mackie, MD;  Location: [facility];  Service: Gynecology;  Laterality: N/A;  . DILATION AND EVACUATION N/A 03/30/2016   Procedure: DILATATION AND EVACUATION WITH INTRAOPERATIVE ULTRASOUND;  Surgeon: Sherian Rein, MD;  Location: [facility];  Service: Gynecology;  Laterality: N/A;  . WISDOM TOOTH EXTRACTION     Family History: family history includes Alzheimer's disease in her paternal grandmother; Bipolar disorder in her sister; Cancer in her maternal grandfather, mother, paternal grandfather, and paternal grandmother; Diabetes in her maternal grandfather and paternal grandfather; Drug abuse in her sister; Hypertension in her maternal grandmother and mother; Thyroid disease in her maternal aunt. Social History:  reports that she quit smoking about 7 years ago. She has never used smokeless tobacco. She reports that she does not drink alcohol or use drugs.     Maternal Diabetes: No Genetic Screening: Normal Maternal Ultrasounds/Referrals: Normal Fetal Ultrasounds or other Referrals:  None Maternal Substance Abuse:   No Significant Maternal Medications:  None Significant Maternal Lab Results:  None Other Comments:  None  Review of Systems  Constitutional: Negative.   All other systems reviewed and are negative.  Maternal Medical History:  Contractions: Onset was less than 1 hour ago.   Frequency: rare.   Perceived severity is mild.    Fetal activity: Perceived fetal activity is normal.   Last perceived fetal movement was within the past hour.    Prenatal complications: Placental abnormality.   Prenatal Complications - Diabetes: none.      Last menstrual period 04/23/2017, unknown if currently breastfeeding. Maternal Exam:  Uterine Assessment: Contraction strength is mild.  Contraction frequency is rare.   Abdomen: Patient reports no abdominal tenderness. Fetal presentation: vertex  Introitus: Normal vulva. Normal vagina.  Ferning test: not done.  Nitrazine test: not done. Amniotic fluid character: not assessed.  Pelvis: adequate for delivery.   Cervix: Cervix evaluated by sterile speculum exam.     Physical Exam  Nursing note and vitals reviewed. Constitutional: She is oriented to person, place, and time. She appears well-developed and well-nourished.  HENT:  Head: Normocephalic and atraumatic.  Neck: Normal range of motion. Neck supple.  Cardiovascular: Normal rate and regular rhythm.  Respiratory: Effort normal and breath sounds normal.  GI: Soft. Bowel sounds are normal.  Genitourinary: Vagina normal and uterus normal.  Musculoskeletal: Normal range of motion.  Neurological: She is alert and  oriented to person, place, and time. She has normal reflexes.  Skin: Skin is warm and dry.  Psychiatric: She has a normal mood and affect.    Prenatal labs: ABO, Rh: B/Negative/-- (09/18 0000) Antibody: Negative (09/18 0000) Rubella: Immune (09/18 0000) RPR: Nonreactive (09/18 0000)  HBsAg: Negative (09/18 0000)  HIV: Non-reactive (09/18 0000)  GBS:     Assessment/Plan: 38+  weeks Marginal previa Primary csection. Risks vs benefits of csection discussed.  Bleeding precautions and possible need for transfusion discussed.    Crissy Mccreadie J 01/10/2018, 10:03 AM

## 2018-01-11 ENCOUNTER — Encounter (HOSPITAL_COMMUNITY): Payer: Self-pay | Admitting: *Deleted

## 2018-01-11 ENCOUNTER — Inpatient Hospital Stay (HOSPITAL_COMMUNITY): Payer: BLUE CROSS/BLUE SHIELD | Admitting: Certified Registered Nurse Anesthetist

## 2018-01-11 ENCOUNTER — Encounter (HOSPITAL_COMMUNITY)
Admission: AD | Disposition: A | Discharge status: Home / Self Care | Payer: Self-pay | Source: Ambulatory Visit | Attending: Obstetrics and Gynecology

## 2018-01-11 ENCOUNTER — Inpatient Hospital Stay (HOSPITAL_COMMUNITY)
Admission: AD | Admit: 2018-01-11 | Discharge: 2018-01-13 | DRG: 788 | Disposition: A | Discharge status: Home / Self Care | Payer: BLUE CROSS/BLUE SHIELD | Source: Ambulatory Visit | Attending: Obstetrics and Gynecology | Admitting: Obstetrics and Gynecology

## 2018-01-11 DIAGNOSIS — Z3A38 38 weeks gestation of pregnancy: Secondary | ICD-10-CM

## 2018-01-11 DIAGNOSIS — O4400 Placenta previa specified as without hemorrhage, unspecified trimester: Secondary | ICD-10-CM | POA: Diagnosis present

## 2018-01-11 DIAGNOSIS — Z87891 Personal history of nicotine dependence: Secondary | ICD-10-CM

## 2018-01-11 DIAGNOSIS — L231 Allergic contact dermatitis due to adhesives: Secondary | ICD-10-CM | POA: Diagnosis not present

## 2018-01-11 DIAGNOSIS — O4423 Partial placenta previa NOS or without hemorrhage, third trimester: Principal | ICD-10-CM | POA: Diagnosis present

## 2018-01-11 DIAGNOSIS — Z412 Encounter for routine and ritual male circumcision: Secondary | ICD-10-CM | POA: Diagnosis not present

## 2018-01-11 DIAGNOSIS — O4403 Placenta previa specified as without hemorrhage, third trimester: Secondary | ICD-10-CM | POA: Diagnosis not present

## 2018-01-11 LAB — CBC
HCT: 38.6 % (ref 36.0–46.0)
Hemoglobin: 13 g/dL (ref 12.0–15.0)
MCH: 30.9 pg (ref 26.0–34.0)
MCHC: 33.7 g/dL (ref 30.0–36.0)
MCV: 91.7 fL (ref 78.0–100.0)
PLATELETS: 149 10*3/uL — AB (ref 150–400)
RBC: 4.21 MIL/uL (ref 3.87–5.11)
RDW: 13.4 % (ref 11.5–15.5)
WBC: 11.1 10*3/uL — ABNORMAL HIGH (ref 4.0–10.5)

## 2018-01-11 LAB — RPR: RPR Ser Ql: NONREACTIVE

## 2018-01-11 SURGERY — Surgical Case
Anesthesia: Spinal

## 2018-01-11 MED ORDER — SENNOSIDES-DOCUSATE SODIUM 8.6-50 MG PO TABS
2.0000 | ORAL_TABLET | ORAL | Status: DC
  Administered 2018-01-11 – 2018-01-13 (×2): 2 via ORAL
  Filled 2018-01-11: qty 2

## 2018-01-11 MED ORDER — OXYCODONE-ACETAMINOPHEN 5-325 MG PO TABS: 2.0000 | ORAL_TABLET | ORAL | Status: DC | PRN

## 2018-01-11 MED ORDER — MORPHINE SULFATE (PF) 0.5 MG/ML IJ SOLN: INTRAMUSCULAR | Status: DC | PRN

## 2018-01-11 MED ORDER — ZOLPIDEM TARTRATE 5 MG PO TABS: 5.0000 mg | ORAL_TABLET | Freq: Every evening | ORAL | Status: DC | PRN

## 2018-01-11 MED ORDER — SCOPOLAMINE 1 MG/3DAYS TD PT72
MEDICATED_PATCH | TRANSDERMAL | Status: DC | PRN
  Administered 2018-01-11: 1 via TRANSDERMAL

## 2018-01-11 MED ORDER — LACTATED RINGERS IV SOLN
INTRAVENOUS | Status: DC
  Administered 2018-01-11 (×3): via INTRAVENOUS

## 2018-01-11 MED ORDER — DEXAMETHASONE SODIUM PHOSPHATE 4 MG/ML IJ SOLN
INTRAMUSCULAR | Status: DC | PRN
  Administered 2018-01-11: 4 mg via INTRAVENOUS

## 2018-01-11 MED ORDER — SIMETHICONE 80 MG PO CHEW
80.0000 mg | CHEWABLE_TABLET | Freq: Three times a day (TID) | ORAL | Status: DC
  Administered 2018-01-12 – 2018-01-13 (×3): 80 mg via ORAL
  Filled 2018-01-11 (×4): qty 1

## 2018-01-11 MED ORDER — WITCH HAZEL-GLYCERIN EX PADS: 1.0000 "application " | MEDICATED_PAD | CUTANEOUS | Status: DC | PRN

## 2018-01-11 MED ORDER — DIBUCAINE 1 % RE OINT: 1.0000 "application " | TOPICAL_OINTMENT | RECTAL | Status: DC | PRN

## 2018-01-11 MED ORDER — SIMETHICONE 80 MG PO CHEW
80.0000 mg | CHEWABLE_TABLET | ORAL | Status: DC
  Administered 2018-01-11 – 2018-01-13 (×2): 80 mg via ORAL
  Filled 2018-01-11: qty 1

## 2018-01-11 MED ORDER — ONDANSETRON HCL 4 MG/2ML IJ SOLN
INTRAMUSCULAR | Status: AC
  Filled 2018-01-11: qty 2

## 2018-01-11 MED ORDER — BUPIVACAINE HCL (PF) 0.25 % IJ SOLN
INTRAMUSCULAR | Status: AC
  Filled 2018-01-11: qty 30

## 2018-01-11 MED ORDER — IBUPROFEN 600 MG PO TABS
600.0000 mg | ORAL_TABLET | Freq: Four times a day (QID) | ORAL | Status: DC
  Administered 2018-01-11 – 2018-01-13 (×6): 600 mg via ORAL
  Filled 2018-01-11 (×6): qty 1

## 2018-01-11 MED ORDER — DIPHENHYDRAMINE HCL 25 MG PO CAPS
25.0000 mg | ORAL_CAPSULE | Freq: Four times a day (QID) | ORAL | Status: DC | PRN
  Administered 2018-01-11: 25 mg via ORAL

## 2018-01-11 MED ORDER — NALBUPHINE HCL 10 MG/ML IJ SOLN
5.0000 mg | INTRAMUSCULAR | Status: DC | PRN
  Administered 2018-01-11: 5 mg via INTRAVENOUS

## 2018-01-11 MED ORDER — FENTANYL CITRATE (PF) 100 MCG/2ML IJ SOLN: 25.0000 ug | INTRAMUSCULAR | Status: DC | PRN

## 2018-01-11 MED ORDER — NALBUPHINE HCL 10 MG/ML IJ SOLN: 5.0000 mg | Freq: Once | INTRAMUSCULAR | Status: DC | PRN

## 2018-01-11 MED ORDER — MORPHINE SULFATE (PF) 0.5 MG/ML IJ SOLN
INTRAMUSCULAR | Status: AC
  Filled 2018-01-11: qty 10

## 2018-01-11 MED ORDER — KETOROLAC TROMETHAMINE 30 MG/ML IJ SOLN
30.0000 mg | Freq: Once | INTRAMUSCULAR | Status: AC
  Administered 2018-01-11: 30 mg via INTRAVENOUS
  Filled 2018-01-11: qty 1

## 2018-01-11 MED ORDER — CEFAZOLIN SODIUM-DEXTROSE 2-4 GM/100ML-% IV SOLN
2.0000 g | INTRAVENOUS | Status: AC
  Administered 2018-01-11: 2 g via INTRAVENOUS
  Filled 2018-01-11: qty 100

## 2018-01-11 MED ORDER — DIPHENHYDRAMINE HCL 25 MG PO CAPS
25.0000 mg | ORAL_CAPSULE | ORAL | Status: DC | PRN
  Filled 2018-01-11: qty 1

## 2018-01-11 MED ORDER — OXYTOCIN 40 UNITS IN LACTATED RINGERS INFUSION - SIMPLE MED: 2.5000 [IU]/h | INTRAVENOUS | Status: DC

## 2018-01-11 MED ORDER — NALBUPHINE HCL 10 MG/ML IJ SOLN: 5.0000 mg | INTRAMUSCULAR | Status: DC | PRN

## 2018-01-11 MED ORDER — SODIUM CHLORIDE 0.9 % IV SOLN
1000.0000 mg | INTRAVENOUS | Status: AC
  Administered 2018-01-11: 1000 mg via INTRAVENOUS
  Filled 2018-01-11: qty 10

## 2018-01-11 MED ORDER — MENTHOL 3 MG MT LOZG: 1.0000 | LOZENGE | OROMUCOSAL | Status: DC | PRN

## 2018-01-11 MED ORDER — OXYCODONE-ACETAMINOPHEN 5-325 MG PO TABS
1.0000 | ORAL_TABLET | ORAL | Status: DC | PRN
  Administered 2018-01-12 – 2018-01-13 (×2): 1 via ORAL
  Filled 2018-01-11 (×2): qty 1

## 2018-01-11 MED ORDER — SCOPOLAMINE 1 MG/3DAYS TD PT72
1.0000 | MEDICATED_PATCH | Freq: Once | TRANSDERMAL | Status: DC
  Filled 2018-01-11: qty 1

## 2018-01-11 MED ORDER — DIPHENHYDRAMINE HCL 50 MG/ML IJ SOLN: 12.5000 mg | INTRAMUSCULAR | Status: DC | PRN

## 2018-01-11 MED ORDER — ONDANSETRON HCL 4 MG/2ML IJ SOLN
INTRAMUSCULAR | Status: DC | PRN
  Administered 2018-01-11: 4 mg via INTRAVENOUS

## 2018-01-11 MED ORDER — TETANUS-DIPHTH-ACELL PERTUSSIS 5-2.5-18.5 LF-MCG/0.5 IM SUSP: 0.5000 mL | Freq: Once | INTRAMUSCULAR | Status: DC

## 2018-01-11 MED ORDER — OXYTOCIN 10 UNIT/ML IJ SOLN
INTRAMUSCULAR | Status: AC
  Filled 2018-01-11: qty 4

## 2018-01-11 MED ORDER — DEXAMETHASONE SODIUM PHOSPHATE 4 MG/ML IJ SOLN
INTRAMUSCULAR | Status: AC
  Filled 2018-01-11: qty 1

## 2018-01-11 MED ORDER — PRENATAL MULTIVITAMIN CH
1.0000 | ORAL_TABLET | Freq: Every day | ORAL | Status: DC
  Administered 2018-01-12: 1 via ORAL
  Filled 2018-01-11: qty 1

## 2018-01-11 MED ORDER — PHENYLEPHRINE 8 MG IN D5W 100 ML (0.08MG/ML) PREMIX OPTIME
INJECTION | INTRAVENOUS | Status: DC | PRN
  Administered 2018-01-11: 60 ug/min via INTRAVENOUS

## 2018-01-11 MED ORDER — SODIUM CHLORIDE 0.9% FLUSH
3.0000 mL | INTRAVENOUS | Status: DC | PRN
Start: 2018-01-11 — End: 2018-01-12

## 2018-01-11 MED ORDER — FENTANYL CITRATE (PF) 100 MCG/2ML IJ SOLN
INTRAMUSCULAR | Status: DC | PRN
  Administered 2018-01-11: 25 ug via INTRATHECAL

## 2018-01-11 MED ORDER — NALOXONE HCL 4 MG/10ML IJ SOLN
1.0000 ug/kg/h | INTRAVENOUS | Status: DC | PRN
  Filled 2018-01-11: qty 5

## 2018-01-11 MED ORDER — ACETAMINOPHEN 325 MG PO TABS
650.0000 mg | ORAL_TABLET | ORAL | Status: DC | PRN
Start: 2018-01-11 — End: 2018-01-13
  Administered 2018-01-11 – 2018-01-12 (×2): 650 mg via ORAL
  Filled 2018-01-11 (×2): qty 2

## 2018-01-11 MED ORDER — OXYTOCIN 10 UNIT/ML IJ SOLN
INTRAVENOUS | Status: DC | PRN
  Administered 2018-01-11: 40 [IU] via INTRAVENOUS

## 2018-01-11 MED ORDER — ACETAMINOPHEN 160 MG/5ML PO SOLN
975.0000 mg | Freq: Once | ORAL | Status: AC
  Administered 2018-01-11: 975 mg via ORAL
  Filled 2018-01-11: qty 40.6

## 2018-01-11 MED ORDER — SIMETHICONE 80 MG PO CHEW: 80.0000 mg | CHEWABLE_TABLET | ORAL | Status: DC | PRN

## 2018-01-11 MED ORDER — MEPERIDINE HCL 25 MG/ML IJ SOLN: 6.2500 mg | INTRAMUSCULAR | Status: DC | PRN

## 2018-01-11 MED ORDER — MORPHINE SULFATE (PF) 0.5 MG/ML IJ SOLN
INTRAMUSCULAR | Status: DC | PRN
  Administered 2018-01-11: .2 mg via INTRATHECAL

## 2018-01-11 MED ORDER — LACTATED RINGERS IV SOLN
INTRAVENOUS | Status: DC
  Administered 2018-01-11: 23:00:00 via INTRAVENOUS

## 2018-01-11 MED ORDER — FENTANYL CITRATE (PF) 100 MCG/2ML IJ SOLN
INTRAMUSCULAR | Status: AC
  Filled 2018-01-11: qty 2

## 2018-01-11 MED ORDER — BUPIVACAINE IN DEXTROSE 0.75-8.25 % IT SOLN
INTRATHECAL | Status: DC | PRN
  Administered 2018-01-11: 1.8 mL via INTRATHECAL

## 2018-01-11 MED ORDER — METHYLERGONOVINE MALEATE 0.2 MG/ML IJ SOLN: 0.2000 mg | INTRAMUSCULAR | Status: DC | PRN

## 2018-01-11 MED ORDER — ONDANSETRON HCL 4 MG/2ML IJ SOLN: 4.0000 mg | Freq: Three times a day (TID) | INTRAMUSCULAR | Status: DC | PRN

## 2018-01-11 MED ORDER — COCONUT OIL OIL: 1.0000 "application " | TOPICAL_OIL | Status: DC | PRN

## 2018-01-11 MED ORDER — NALOXONE HCL 0.4 MG/ML IJ SOLN
0.4000 mg | INTRAMUSCULAR | Status: DC | PRN
Start: 2018-01-11 — End: 2018-01-12

## 2018-01-11 MED ORDER — PHENYLEPHRINE 8 MG IN D5W 100 ML (0.08MG/ML) PREMIX OPTIME
INJECTION | INTRAVENOUS | Status: AC
  Filled 2018-01-11: qty 100

## 2018-01-11 MED ORDER — BUPIVACAINE HCL (PF) 0.25 % IJ SOLN
INTRAMUSCULAR | Status: DC | PRN
  Administered 2018-01-11: 20 mL

## 2018-01-11 MED ORDER — METHYLERGONOVINE MALEATE 0.2 MG PO TABS: 0.2000 mg | ORAL_TABLET | ORAL | Status: DC | PRN

## 2018-01-11 MED ORDER — NALBUPHINE HCL 10 MG/ML IJ SOLN
INTRAMUSCULAR | Status: AC
  Administered 2018-01-11: 5 mg via INTRAVENOUS
  Filled 2018-01-11: qty 1

## 2018-01-11 SURGICAL SUPPLY — 37 items
ADH SKN CLS APL DERMABOND .7 (GAUZE/BANDAGES/DRESSINGS) ×1
CHLORAPREP W/TINT 26ML (MISCELLANEOUS) ×2 IMPLANT
CLAMP CORD UMBIL (MISCELLANEOUS) IMPLANT
CLOTH BEACON ORANGE TIMEOUT ST (SAFETY) ×2 IMPLANT
DECANTER SPIKE VIAL GLASS SM (MISCELLANEOUS) ×2 IMPLANT
DERMABOND ADVANCED (GAUZE/BANDAGES/DRESSINGS) ×1
DERMABOND ADVANCED .7 DNX12 (GAUZE/BANDAGES/DRESSINGS) IMPLANT
DRSG OPSITE POSTOP 4X10 (GAUZE/BANDAGES/DRESSINGS) ×2 IMPLANT
ELECT REM PT RETURN 9FT ADLT (ELECTROSURGICAL) ×2
ELECTRODE REM PT RTRN 9FT ADLT (ELECTROSURGICAL) ×1 IMPLANT
EXTRACTOR VACUUM M CUP 4 TUBE (SUCTIONS) IMPLANT
GAUZE SPONGE 4X4 12PLY STRL (GAUZE/BANDAGES/DRESSINGS) ×2 IMPLANT
GLOVE BIO SURGEON STRL SZ7.5 (GLOVE) ×2 IMPLANT
GLOVE BIOGEL PI IND STRL 7.0 (GLOVE) ×1 IMPLANT
GLOVE BIOGEL PI INDICATOR 7.0 (GLOVE) ×1
GOWN STRL REUS W/TWL LRG LVL3 (GOWN DISPOSABLE) ×4 IMPLANT
KIT ABG SYR 3ML LUER SLIP (SYRINGE) IMPLANT
NEEDLE HYPO 22GX1.5 SAFETY (NEEDLE) ×2 IMPLANT
NEEDLE HYPO 25X5/8 SAFETYGLIDE (NEEDLE) IMPLANT
NEEDLE SPNL 20GX3.5 QUINCKE YW (NEEDLE) IMPLANT
NS IRRIG 1000ML POUR BTL (IV SOLUTION) ×2 IMPLANT
PACK C SECTION WH (CUSTOM PROCEDURE TRAY) ×2 IMPLANT
PENCIL SMOKE EVAC W/HOLSTER (ELECTROSURGICAL) ×2 IMPLANT
SUT MNCRL 0 VIOLET CTX 36 (SUTURE) ×2 IMPLANT
SUT MNCRL AB 3-0 PS2 27 (SUTURE) IMPLANT
SUT MON AB 2-0 CT1 27 (SUTURE) ×2 IMPLANT
SUT MON AB-0 CT1 36 (SUTURE) ×4 IMPLANT
SUT MONOCRYL 0 CTX 36 (SUTURE) ×2
SUT PLAIN 0 NONE (SUTURE) IMPLANT
SUT PLAIN 2 0 (SUTURE)
SUT PLAIN 2 0 XLH (SUTURE) ×2 IMPLANT
SUT PLAIN ABS 2-0 CT1 27XMFL (SUTURE) IMPLANT
SUT VIC AB 4-0 KS 27 (SUTURE) ×2 IMPLANT
SYR 20CC LL (SYRINGE) IMPLANT
SYR CONTROL 10ML LL (SYRINGE) ×2 IMPLANT
TOWEL OR 17X24 6PK STRL BLUE (TOWEL DISPOSABLE) ×2 IMPLANT
TRAY FOLEY BAG SILVER LF 14FR (SET/KITS/TRAYS/PACK) ×2 IMPLANT

## 2018-01-11 NOTE — Op Note (Signed)
Cesarean Section Procedure Note  Indications: placenta previa  Pre-operative Diagnosis: 38 week 2 day pregnancy.  Post-operative Diagnosis: same  Surgeon: Lenoard AdenAAVON,Amma Crear J   Assistants: Idelle JoSigmon, CNM  Anesthesia: Local anesthesia 0.25.% bupivacaine and Spinal anesthesia  ASA Class: 2  Procedure Details  The patient was seen in the Holding Room. The risks, benefits, complications, treatment options, and expected outcomes were discussed with the patient.  The patient concurred with the proposed plan, giving informed consent. The risks of anesthesia, infection, bleeding and possible injury to other organs discussed. Injury to bowel, bladder, or ureter with possible need for repair discussed. Possible need for transfusion with secondary risks of hepatitis or HIV acquisition discussed. Post operative complications to include but not limited to DVT, PE and Pneumonia noted. The site of surgery properly noted/marked. The patient was taken to Operating Room # 1, identified as Anna Blackwell and the procedure verified as C-Section Delivery. A Time Out was held and the above information confirmed.  After induction of anesthesia, the patient was draped and prepped in the usual sterile manner. A Pfannenstiel incision was made and carried down through the subcutaneous tissue to the fascia. Fascial incision was made and extended transversely using Mayo scissors. The fascia was separated from the underlying rectus tissue superiorly and inferiorly. The peritoneum was identified and entered. Peritoneal incision was extended longitudinally. The utero-vesical peritoneal reflection was incised transversely and the bladder flap was bluntly freed from the lower uterine segment. A low transverse uterine incision(Kerr hysterotomy) was made. Delivered from OA presentation with vacuum assistance x one pull was a  female with Apgar scores of 8 at one minute and 9 at five minutes. Bulb suctioning gently performed. Neonatal  team in attendance.After the umbilical cord was clamped and cut cord blood was obtained for evaluation. The placenta was removed intact and appeared normal. The uterus was curetted with a dry lap pack. Good hemostasis was noted.The uterine outline, tubes and ovaries appeared normal. One interrupted 1-0 chromic suture placed in placental bed for hemostasis.The uterine incision was closed with running locked sutures of 0 Monocryl x 2 layers. Hemostasis was observed.The parietal peritoneum was closed with a running 2-0 Monocryl suture. The fascia was then reapproximated with running sutures of 0 Monocryl. The skin was reapproximated with 4-0 vicryl after Dalzell closure with 2-0 plain.  Instrument, sponge, and needle counts were correct prior the abdominal closure and at the conclusion of the case.   Findings: As noted. Left lateral marginal previa.   Estimated Blood Loss:  600         Drains: foley                 Specimens: placenta                 Complications:  None; patient tolerated the procedure well.         Disposition: PACU - hemodynamically stable.         Condition: stable  Attending Attestation: I performed the procedure.

## 2018-01-11 NOTE — Anesthesia Preprocedure Evaluation (Signed)
Anesthesia Evaluation  Patient identified by MRN, date of birth, ID band Patient awake    Reviewed: Allergy & Precautions, H&P , Patient's Chart, lab work & pertinent test results  Airway Mallampati: II  TM Distance: >3 FB Neck ROM: full    Dental no notable dental hx.    Pulmonary former smoker,    Pulmonary exam normal breath sounds clear to auscultation       Cardiovascular Exercise Tolerance: Good  Rhythm:regular Rate:Normal     Neuro/Psych    GI/Hepatic   Endo/Other    Renal/GU      Musculoskeletal   Abdominal   Peds  Hematology   Anesthesia Other Findings   Reproductive/Obstetrics                             Anesthesia Physical Anesthesia Plan  ASA: II  Anesthesia Plan: Spinal   Post-op Pain Management:    Induction:   PONV Risk Score and Plan:   Airway Management Planned:   Additional Equipment:   Intra-op Plan:   Post-operative Plan:   Informed Consent: I have reviewed the patients History and Physical, chart, labs and discussed the procedure including the risks, benefits and alternatives for the proposed anesthesia with the patient or authorized representative who has indicated his/her understanding and acceptance.     Plan Discussed with:   Anesthesia Plan Comments: (  )        Anesthesia Quick Evaluation  

## 2018-01-11 NOTE — Transfer of Care (Signed)
Immediate Anesthesia Transfer of Care Note  Patient: Anna Blackwell  Procedure(s) Performed: Primary CESAREAN SECTION (N/A )  Patient Location: PACU  Anesthesia Type:Spinal  Level of Consciousness: awake, alert , oriented and patient cooperative  Airway & Oxygen Therapy: Patient Spontanous Breathing  Post-op Assessment: Report given to RN and Post -op Vital signs reviewed and stable  Post vital signs: Reviewed and stable  Last Vitals:  Vitals Value Taken Time  BP 109/63 01/11/2018  2:37 PM  Temp    Pulse 65 01/11/2018  2:39 PM  Resp 21 01/11/2018  2:39 PM  SpO2 97 % 01/11/2018  2:39 PM  Vitals shown include unvalidated device data.  Last Pain:  Vitals:   01/11/18 1151  TempSrc: Oral         Complications: No apparent anesthesia complications

## 2018-01-11 NOTE — Progress Notes (Signed)
Patient states she had twisted her knee yesterday and its swollen and sore.

## 2018-01-11 NOTE — Progress Notes (Signed)
Patient ID: Anna Blackwell, female   DOB: 30-Mar-1981, 37 y.o.   MRN: [id number] Patient seen and examined. Consent witnessed and signed. No changes noted. Update completed. BP 129/74   Pulse 76   Temp 98.2 F (36.8 C) (Oral)   Resp 18   Ht 5\' 8"  (1.727 m)   Wt 101.5 kg (223 lb 11.2 oz)   LMP 04/23/2017 (Approximate)   BMI 34.01 kg/m   CBC    Component Value Date/Time   WBC 11.1 (H) 01/11/2018 1151   RBC 4.21 01/11/2018 1151   HGB 13.0 01/11/2018 1151   HCT 38.6 01/11/2018 1151   PLT 149 (L) 01/11/2018 1151   MCV 91.7 01/11/2018 1151   MCH 30.9 01/11/2018 1151   MCHC 33.7 01/11/2018 1151   RDW 13.4 01/11/2018 1151   Blood products discussed.

## 2018-01-11 NOTE — Anesthesia Procedure Notes (Signed)
Spinal  Patient location during procedure: OR Staffing Anesthesiologist: Marisal Swarey, MD Spinal Block Patient position: sitting Prep: DuraPrep Patient monitoring: heart rate, blood pressure and continuous pulse ox Approach: right paramedian Location: L3-4 Injection technique: single-shot Needle Needle type: Sprotte  Needle gauge: 24 G Needle length: 9 cm Assessment Sensory level: T4 Additional Notes Spinal Dosage in OR  .75% Bupivicaine ml       1.8     [id number]   mcg        100    Fentanyl mcg            25      

## 2018-01-12 ENCOUNTER — Encounter (HOSPITAL_COMMUNITY): Payer: Self-pay | Admitting: *Deleted

## 2018-01-12 LAB — CBC
HEMATOCRIT: 31.8 % — AB (ref 36.0–46.0)
Hemoglobin: 10.8 g/dL — ABNORMAL LOW (ref 12.0–15.0)
MCH: 31.3 pg (ref 26.0–34.0)
MCHC: 34 g/dL (ref 30.0–36.0)
MCV: 92.2 fL (ref 78.0–100.0)
PLATELETS: 120 10*3/uL — AB (ref 150–400)
RBC: 3.45 MIL/uL — ABNORMAL LOW (ref 3.87–5.11)
RDW: 13.5 % (ref 11.5–15.5)
WBC: 11.4 10*3/uL — AB (ref 4.0–10.5)

## 2018-01-12 LAB — BIRTH TISSUE RECOVERY COLLECTION (PLACENTA DONATION)

## 2018-01-12 MED ORDER — RHO D IMMUNE GLOBULIN 1500 UNIT/2ML IJ SOSY
300.0000 ug | PREFILLED_SYRINGE | Freq: Once | INTRAMUSCULAR | Status: AC
  Administered 2018-01-12: 300 ug via INTRAVENOUS
  Filled 2018-01-12: qty 2

## 2018-01-12 NOTE — Lactation Note (Signed)
This note was copied from a baby's chart. Lactation Consultation Note  Patient Name: Anna Blackwell ZOXWR'UToday's Date: 01/12/2018 Reason for consult: Initial assessment;Term  G2P2 mother whose infant is now 6922 hours old.  Mother has prior breastfeeding experience with her first child.  Mother states that baby has been latching well and she has no questions/concerns at this time.  Her breasts are soft and non-tender.  LC encouraged her to call RN/LC the next time baby feeds so a latch score can be assessed.  Marijean NiemannJaime, RN in the room and offered to help with this.  Mother states that she struggles with finding  a comfortable position due to baby being on her C-section incision during feeds.  RN and LC suggested the football hold for next feed and we are going to assist with this hold when mother calls out.    Mom encouraged to feed baby 8-12 times/24 hours and with feeding cues. Mom made aware of O/P services, breastfeeding support groups, community resources, and our phone # for post-discharge questions. Family is present and supportive. Maternal Data Formula Feeding for Exclusion: No Does the patient have breastfeeding experience prior to this delivery?: Yes  Feeding Feeding Type: Breast Fed Length of feed: 30 min  LATCH Score                   Interventions    Lactation Tools Discussed/Used     Consult Status Consult Status: Follow-up Date: 01/13/18 Follow-up type: In-patient    Denice Cardon R Jaimi Belle 01/12/2018, 12:09 PM

## 2018-01-12 NOTE — Anesthesia Postprocedure Evaluation (Signed)
Anesthesia Post Note  Patient: Anna Blackwell  Procedure(s) Performed: Primary CESAREAN SECTION (N/A )     Patient location during evaluation: Mother Baby Anesthesia Type: Spinal Level of consciousness: awake and alert and oriented Pain management: satisfactory to patient Vital Signs Assessment: post-procedure vital signs reviewed and stable Respiratory status: respiratory function stable and spontaneous breathing Cardiovascular status: blood pressure returned to baseline Postop Assessment: no headache, no backache, spinal receding, patient able to bend at knees and adequate PO intake Anesthetic complications: no    Last Vitals:  Vitals:   01/12/18 0315 01/12/18 0530  BP:  (!) 101/32  Pulse:  (!) 54  Resp:  18  Temp:  36.6 C  SpO2: 95%     Last Pain:  Vitals:   01/12/18 0530  TempSrc: Oral  PainSc: 0-No pain   Pain Goal:                 [name]

## 2018-01-12 NOTE — Addendum Note (Signed)
Addendum  created 01/12/18 [id number] by Graciela HusbandsFussell, Berdene Askari O, CRNA   Sign clinical note

## 2018-01-12 NOTE — Progress Notes (Signed)
POSTOPERATIVE DAY # 1 S/P CS for marginal previa   S:         Reports feeling well             Tolerating po intake / no nausea / no vomiting / no flatus / no BM             Bleeding is light             Pain controlled with long-term narcotic - causing moderate itching             Up ad lib / ambulatory/ voiding QS  Newborn Breast   O:  VS: BP (!) 101/32 (BP Location: Right Arm)   Pulse (!) 54   Temp 97.8 F (36.6 C) (Oral)   Resp 18   Ht 5\' 8"  (1.727 m)   Wt 101.5 kg (223 lb 11.2 oz)   LMP 04/23/2017 (Approximate)   SpO2 95%   Breastfeeding? Unknown   BMI 34.01 kg/m    LABS:               Recent Labs    01/11/18 1151 01/12/18 0525  WBC 11.1* 11.4*  HGB 13.0 10.8*  PLT 149* 120*               Bloodtype: --/--/B NEG (04/08 1018)  Nils Flack/newborn A positive - Rhogam ordered  Rubella: Immune (09/18 0000)                                        I&O: Intake/Output      04/09 0701 - 04/10 0700 04/10 0701 - 04/11 0700   I.V. (mL/kg) 1500 (14.8)    Total Intake(mL/kg) 1500 (14.8)    Urine (mL/kg/hr) 975    Blood 800    Total Output 1775    Net -275         Urine Occurrence 1 x                Physical Exam:             Alert and Oriented X3  Lungs: Clear and unlabored  Heart: regular rate and rhythm / no mumurs  Abdomen: soft, non-tender, mildly distended             Fundus: firm, non-tender, Ueven             Dressing intact              Incision:  approximated with suture / no erythema / no ecchymosis / no drainage  Perineum: intact  Lochia: light  Extremities: trace edema, no calf pain or tenderness, SCD in place  A:        POD # 1 S/P CS             P:        Routine postoperative care              Consider early DC tomorrow    Marlinda Mikeanya Malgorzata Albert CNM, MSN, [facility] 01/12/2018, 8:40 AM

## 2018-01-12 NOTE — Anesthesia Postprocedure Evaluation (Signed)
Anesthesia Post Note  Patient: Anna Blackwell  Procedure(s) Performed: Primary CESAREAN SECTION (N/A )     Patient location during evaluation: PACU Anesthesia Type: Spinal Level of consciousness: awake Pain management: satisfactory to patient Vital Signs Assessment: post-procedure vital signs reviewed and stable Respiratory status: spontaneous breathing Cardiovascular status: blood pressure returned to baseline Postop Assessment: no headache and spinal receding Anesthetic complications: no    Last Vitals:  Vitals:   01/12/18 0315 01/12/18 0530  BP:  (!) 101/32  Pulse:  (!) 54  Resp:  18  Temp:  36.6 C  SpO2: 95%     Last Pain:  Vitals:   01/12/18 0530  TempSrc: Oral  PainSc: 0-No pain                 Elih Mooney EDWARD

## 2018-01-12 NOTE — Progress Notes (Signed)
MOB was referred for history of depression/anxiety. * Referral screened out by Clinical Social Worker because none of the following criteria appear to apply: ~ History of anxiety/depression during this pregnancy, or of post-partum depression. ~ Diagnosis of anxiety and/or depression within last 3 years OR * MOB's symptoms currently being treated with medication and/or therapy. Please contact the Clinical Social Worker if needs arise or by MOB request.  CSW notes that MOB scored a 6 on the Edinburgh Postpartum Depression Screen. 

## 2018-01-13 ENCOUNTER — Encounter (HOSPITAL_COMMUNITY): Payer: Self-pay | Admitting: Obstetrics and Gynecology

## 2018-01-13 LAB — RH IG WORKUP (INCLUDES ABO/RH)
ABO/RH(D): B NEG
Fetal Screen: NEGATIVE
GESTATIONAL AGE(WKS): 38.3
Unit division: 0

## 2018-01-13 MED ORDER — IBUPROFEN 600 MG PO TABS: 600.0000 mg | ORAL_TABLET | Freq: Four times a day (QID) | ORAL | 0 refills | Status: DC

## 2018-01-13 MED ORDER — OXYCODONE-ACETAMINOPHEN 5-325 MG PO TABS: 1.0000 | ORAL_TABLET | ORAL | 0 refills | Status: DC | PRN

## 2018-01-13 NOTE — Lactation Note (Signed)
This note was copied from a baby's chart. Lactation Consultation Note  Patient Name: Anna Blackwell ZOXWR'UToday's Date: 01/13/2018      [facility]  G2P2 mother whose infant is now 6242 hours old.  Mother is anticipating discharge today.  She states that her feedings are going well and that she has no questions/concerns at this time.  Her nipples are slightly sore but no breakdown noted.  LC provided comfort gels with instructions for use.  Reviewed using colostrum as a natural lurbicant, hand expression, engorgement prevention/treatment and voiding/stooling pattern of infant.  Mother states her breasts feel heavier today.    Mom made aware of O/P services, breastfeeding support groups, community resources, and our phone # for post-discharge questions.                 Toddrick Sanna R Mirai Greenwood 01/13/2018, 7:46 AM

## 2018-01-13 NOTE — Progress Notes (Signed)
POSTOPERATIVE DAY # 2 S/P CS   S:         Reports feeling ok - wants to go home today             Tolerating po intake / no nausea / no vomiting / + flatus / no BM             Bleeding is light             Pain controlled with motrin and oxycodone             Up ad lib / ambulatory/ voiding QS  Newborn Breast /  O:  VS: BP 102/62 (BP Location: Right Arm)   Pulse 63   Temp 98 F (36.7 C) (Oral)   Resp 14   Ht 5\' 8"  (1.727 m)   Wt 101.5 kg (223 lb 11.2 oz)   LMP 04/23/2017 (Approximate)   SpO2 98%   Breastfeeding? Unknown   BMI 34.01 kg/m                 Physical Exam:             Alert and Oriented X3  Lungs: Clear and unlabored  Heart: regular rate and rhythm / no mumurs  Abdomen: soft, non-tender, non-distended, active BS             Fundus: firm, non-tender, Ueven             Dressing intact - small redness at edges of dressing - left abdomen with erythema in shape of OR dressing - adhesive reaction              Incision:  approximated with suture and glue / small erythema on skin - none near incision/ no ecchymosis / no drainage  Perineum: intact  Lochia: light  Extremities: trace pedal edema, no calf pain or tenderness, negative Homans  A:        POD # 2 S/P CS            Topical dermatitis related to adhesive dressing  P:        Routine postoperative care              Remove honeycomb once home today - apply hydrocortisone cream to rash BID x 7-10 days             DC home - [facility] booklet - instructions reviewed   Anna Blackwell CNM, MSN, [facility] 01/13/2018, 9:01 AM

## 2018-01-13 NOTE — Discharge Summary (Signed)
OB Discharge Summary  Patient Name: Anna Blackwell DOB: [birth year] MRN: [id number]  Date of admission: 01/11/2018  Admitting diagnosis: Low-Lying Placenta, Previa Intrauterine pregnancy: 1970w3d      Date of discharge: 01/13/2018    Discharge diagnosis: Term Pregnancy Delivered and POD 2 s/p primary cesarean section      Prenatal history: G3P2012   EDC : 01/22/2018, by Other Basis  [facility]  Primary provider : Ta[facility]on Prenatal course complicated by previa  Prenatal Labs: ABO, Rh: --/--/B NEG (04/10 0525) / Rhophylac given antenatal and postpartum Antibody: POS (04/08 1018) Rubella: Immune (09/18 0000)   RPR: Non Reactive (04/08 1011)  HBsAg: Negative (09/18 0000)  HIV: Non-reactive (09/18 0000)                                Hospital course:  Sceduled C/S   37 y.o. yo [id number] at 6570w3d was admitted to the hospital 01/11/2018 for scheduled cesarean section with the following indication:Previa.  Membrane Rupture Time/Date: 1:44 PM ,01/11/2018   Patient delivered a Viable infant.01/11/2018  Details of operation can be found in separate operative note.  Pateint had an uncomplicated postpartum course.  She is ambulating, tolerating a regular diet, passing flatus, and urinating well. Patient is discharged home in stable condition on  01/13/18        Delivering PROVIDER: Olivia MackieAAVON, RICHARD                                                            Complications: None  Newborn Data: Live born female  Birth Weight: 8 lb 0.6 oz (3645 g) APGAR: 9, 9  Newborn Delivery   Birth date/time:  01/11/2018 13:45:00 Delivery type:  C-Section, Low Transverse Trial of labor:  No C-section categorization:  Primary     Baby Feeding: Breast Disposition:home with mother  Post partum procedures:rhogam  Labs: Lab Results  Component Value Date   WBC 11.4 (H) 01/12/2018   HGB 10.8 (L) 01/12/2018   HCT 31.8 (L) 01/12/2018   MCV 92.2 01/12/2018   PLT 120  (L) 01/12/2018   No flowsheet data found.    Physical Exam @ time of discharge:  Vitals:   01/12/18 0530 01/12/18 0800 01/12/18 1832 01/13/18 0509  BP: (!) 101/32 107/60 (!) 108/48 102/62  Pulse: (!) 54 62 (!) 58 63  Resp: 18 18 18 14   Temp: 97.8 F (36.6 C) 98 F (36.7 C) 98.6 F (37 C) 98 F (36.7 C)  TempSrc: Oral Oral Oral Oral  SpO2:  100%  98%  Weight:      Height:        General: alert, cooperative and no distress Lochia: appropriate Uterine Fundus: firm Perineum: intact Incision: Healing well with no significant drainage Extremities: DVT Evaluation: No evidence of DVT seen on physical exam.   Discharge instructions:  "Baby and Me Booklet" and [facility] Booklet  Discharge Medications:  Allergies as of 01/13/2018   No Known Allergies     Medication List    TAKE these medications   ibuprofen 600 MG tablet Commonly known as:  ADVIL,MOTRIN Take 1 tablet (600 mg total) by mouth every 6 (six) hours.   oxyCODONE-acetaminophen 5-325  MG tablet Commonly known as:  PERCOCET/ROXICET Take 1-2 tablets by mouth every 4 (four) hours as needed (pain scale 4-7).   PRENATAL GUMMIES/DHA & FA 0.4-32.5 MG Chew Chew 2 each by mouth daily.            Discharge Care Instructions  (From admission, onward)        Start     Ordered   01/13/18 0000  Discharge wound care:    Comments:  Keep incision clean and dry - do NOT pull glue off Remove dressing TODAY after getting home   01/13/18 0906      Diet: routine diet  Activity: Advance as tolerated. Pelvic rest x 6 weeks.   Follow up:6 weeks    Signed: Marlinda Mike CNM, MSN, [facility] 01/13/2018, 9:11 AM

## 2018-01-14 LAB — TYPE AND SCREEN
ABO/RH(D): B NEG
Antibody Screen: POSITIVE
UNIT DIVISION: 0
Unit division: 0

## 2018-01-14 LAB — BPAM RBC
BLOOD PRODUCT EXPIRATION DATE: 201905022359
BLOOD PRODUCT EXPIRATION DATE: 201905072359
UNIT TYPE AND RH: 9500
UNIT TYPE AND RH: 9500

## 2018-02-17 DIAGNOSIS — Z124 Encounter for screening for malignant neoplasm of cervix: Secondary | ICD-10-CM | POA: Diagnosis not present

## 2018-02-18 DIAGNOSIS — Z30430 Encounter for insertion of intrauterine contraceptive device: Secondary | ICD-10-CM | POA: Diagnosis not present

## 2018-03-07 DIAGNOSIS — T148XXA Other injury of unspecified body region, initial encounter: Secondary | ICD-10-CM | POA: Diagnosis not present

## 2018-03-07 DIAGNOSIS — R635 Abnormal weight gain: Secondary | ICD-10-CM | POA: Diagnosis not present

## 2018-03-14 DIAGNOSIS — Z30431 Encounter for routine checking of intrauterine contraceptive device: Secondary | ICD-10-CM | POA: Diagnosis not present

## 2018-10-19 DIAGNOSIS — L710 Perioral dermatitis: Secondary | ICD-10-CM | POA: Diagnosis not present

## 2019-07-26 DIAGNOSIS — F321 Major depressive disorder, single episode, moderate: Secondary | ICD-10-CM | POA: Diagnosis not present

## 2019-08-03 DIAGNOSIS — F321 Major depressive disorder, single episode, moderate: Secondary | ICD-10-CM | POA: Diagnosis not present

## 2019-08-16 DIAGNOSIS — Z01419 Encounter for gynecological examination (general) (routine) without abnormal findings: Secondary | ICD-10-CM | POA: Diagnosis not present

## 2019-08-16 DIAGNOSIS — Z6826 Body mass index (BMI) 26.0-26.9, adult: Secondary | ICD-10-CM | POA: Diagnosis not present

## 2019-08-16 DIAGNOSIS — Z1151 Encounter for screening for human papillomavirus (HPV): Secondary | ICD-10-CM | POA: Diagnosis not present

## 2020-02-06 DIAGNOSIS — I8312 Varicose veins of left lower extremity with inflammation: Secondary | ICD-10-CM | POA: Diagnosis not present

## 2020-02-20 DIAGNOSIS — I8312 Varicose veins of left lower extremity with inflammation: Secondary | ICD-10-CM | POA: Diagnosis not present

## 2020-05-10 ENCOUNTER — Institutional Professional Consult (permissible substitution): Payer: BLUE CROSS/BLUE SHIELD | Admitting: Plastic Surgery

## 2020-06-11 DIAGNOSIS — Z20822 Contact with and (suspected) exposure to covid-19: Secondary | ICD-10-CM | POA: Diagnosis not present

## 2020-08-01 DIAGNOSIS — M545 Low back pain, unspecified: Secondary | ICD-10-CM | POA: Diagnosis not present

## 2020-08-01 DIAGNOSIS — R209 Unspecified disturbances of skin sensation: Secondary | ICD-10-CM | POA: Diagnosis not present

## 2020-08-01 DIAGNOSIS — F411 Generalized anxiety disorder: Secondary | ICD-10-CM | POA: Diagnosis not present

## 2022-04-16 ENCOUNTER — Telehealth: Payer: Self-pay | Admitting: Licensed Clinical Social Worker

## 2022-04-16 NOTE — Telephone Encounter (Signed)
Scheduled appt per 7/13 referral. Pt is aware of appt date and time. Pt is aware to arrive 15 mins prior to appt time and to bring and updated insurance card. Pt is aware of appt location.   

## 2022-05-12 ENCOUNTER — Other Ambulatory Visit: Payer: Self-pay | Admitting: Family Medicine

## 2022-05-12 DIAGNOSIS — Z1231 Encounter for screening mammogram for malignant neoplasm of breast: Secondary | ICD-10-CM

## 2022-06-01 ENCOUNTER — Ambulatory Visit
Admission: RE | Admit: 2022-06-01 | Discharge: 2022-06-01 | Disposition: A | Discharge status: Home / Self Care | Payer: BLUE CROSS/BLUE SHIELD | Source: Ambulatory Visit | Attending: Family Medicine | Admitting: Family Medicine

## 2022-06-01 DIAGNOSIS — Z1231 Encounter for screening mammogram for malignant neoplasm of breast: Secondary | ICD-10-CM

## 2022-06-22 ENCOUNTER — Inpatient Hospital Stay: Payer: BLUE CROSS/BLUE SHIELD | Admitting: Licensed Clinical Social Worker

## 2022-06-22 ENCOUNTER — Other Ambulatory Visit: Payer: BLUE CROSS/BLUE SHIELD

## 2023-04-19 ENCOUNTER — Other Ambulatory Visit: Payer: Self-pay | Admitting: Obstetrics and Gynecology

## 2023-04-19 DIAGNOSIS — Z1231 Encounter for screening mammogram for malignant neoplasm of breast: Secondary | ICD-10-CM

## 2023-06-04 ENCOUNTER — Ambulatory Visit: Payer: BLUE CROSS/BLUE SHIELD

## 2023-06-11 ENCOUNTER — Ambulatory Visit
Admission: RE | Admit: 2023-06-11 | Discharge: 2023-06-11 | Disposition: A | Discharge status: Home / Self Care | Payer: Managed Care, Other (non HMO) | Source: Ambulatory Visit | Attending: Obstetrics and Gynecology | Admitting: Obstetrics and Gynecology

## 2023-06-11 DIAGNOSIS — Z1231 Encounter for screening mammogram for malignant neoplasm of breast: Secondary | ICD-10-CM

## 2023-11-11 ENCOUNTER — Other Ambulatory Visit: Payer: Self-pay | Admitting: Physician Assistant

## 2023-11-11 ENCOUNTER — Telehealth: Payer: Self-pay | Admitting: Internal Medicine

## 2023-11-11 DIAGNOSIS — D696 Thrombocytopenia, unspecified: Secondary | ICD-10-CM

## 2023-11-11 DIAGNOSIS — D72819 Decreased white blood cell count, unspecified: Secondary | ICD-10-CM

## 2023-11-11 NOTE — Progress Notes (Signed)
 [facility] Telephone:([phone]) [phone]   [phone]  CONSULT NOTE  REFERRING PHYSICIAN: Dr. Regino   REASON FOR CONSULTATION:  Leukopenia and thrombocytopenia  HPI Anna Blackwell is a 43 y.o. female with a past medical history significant for anxiety and disc protrusion is referred to the clinic for leukopenia and thrombocytopenia.   The patient saw his PCP for a wellness visit for anxiety and weight loss. The patient had history of thrombocytopenia and leukopenia. The WBC was low at 3.9, normal hemoglobin of 14, and low platelet count of 132k.  Therefore, she had follow up lab work. She had follow up CBC which showed continued mild leukopenia, normal hemoglobin of 14.2, and slightly low 145k. Due to the persistent cytopenias, she was referred to the clinic for evaluation.   Regarding the thrombocytopenia, to the patient's knowledge this has been occurring for several years. The oldest records available to me are from 2012, in which her platelet count was 126k. In general, she typically has a platelet count below the reference range. The lowest platelet count was 101k from 2012 to the present.   The patient denies any known liver disease such as hepatitis, NAFLD, or cirrhosis. She denies splenic disorders.  For alcohol use, she estimates she drinks approximately 1-2 drinks per week . Her drink of choice is either white claw vodka, or a glass of white.  Patient is trying to lose weight and she has been on a diet on and off since 2021 called [employer].  Because the patient suspected she could have a nutrient deficiency she started taking vitamin B12 over-the-counter starting in November 2024.  She denies history of bariatric surgery. For medication, she takes Celexa.  Otherwise she only takes Xanax as needed and 2 tablets of Excedrin 1 to 2 days prior to her menstrual cycle. She denies any herbal supplements. She denies abnormal bleeding, bruising, or bleeding  complications from surgery. Denies any history of HIV. Denies any known tick borne illness. Denies fevers, chills, night sweats, or unexplained weight loss. Denies lymphadenopathy.   Regarding the leukopenia, this has been going on for approximately 6 months to the patients knowledge. The most recent labs prior to 2024 are from 2019 in which the patient did not have any leukocytopenia. The patient denies frequent illness.  Regarding fatigue, the patient is quite busy with work, her 2 children, and housework.  Therefore, she sometimes may feel drained but she is also very active. She denies personal history of autoimmune disorder or inflammatory disorder. She denies abdominal bloating or early satiety.   Her mother had breast cancer in 2011 and was BRCA negative.  Mother also had a stroke 2 to 3 years ago and has blindness in her right eye.  Her father had laryngeal cancer.  Her uncle had colorectal cancer passed away in his 62s.  Her cousin recently had a stroke and had genetic testing performed that showed that she had an abnormality with factor to in addition to having BRCA 2 and MUTHY mutation. The patient is wondering if this is something she would qualify for testing for.   The patient is an network engineer. She is married. She has two children.  She smoked only socially in college but otherwise denies smoking history.  Denies any drug use.    HPI  Past Medical History:  Diagnosis Date   Anxiety    GERD (gastroesophageal reflux disease)    occasionally with milk products   Missed abortion 03/28/2016  No pertinent past medical history    PONV (postoperative nausea and vomiting)    Ruptured cervical disc    S/P dilatation and curettage 03/30/2016   Vaginal delivery 2012    Past Surgical History:  Procedure Laterality Date   CESAREAN SECTION N/A 01/11/2018   Procedure: Primary CESAREAN SECTION;  Surgeon: Gorge Ade, MD;  [facility]: [facility];  Service: Obstetrics;   Laterality: N/A;  EDD: 01/22/18   DILATATION & CURETTAGE/HYSTEROSCOPY WITH MYOSURE N/A 08/12/2016   Procedure: DILATATION & CURETTAGE/HYSTEROSCOPY WITH MYOSURE;  Surgeon: Ade Gorge, MD;  Location: [facility];  Service: Gynecology;  Laterality: N/A;   DILATION AND EVACUATION N/A 03/30/2016   Procedure: DILATATION AND EVACUATION WITH INTRAOPERATIVE ULTRASOUND;  Surgeon: Ezzie Buba, MD;  Location: [facility];  Service: Gynecology;  Laterality: N/A;   WISDOM TOOTH EXTRACTION      Family History  Problem Relation Age of Onset   Breast cancer Mother 34   Hypertension Mother    Cancer Mother    Drug abuse Sister    Bipolar disorder Sister    Thyroid  disease Maternal Aunt    Hypertension Maternal Grandmother    Cancer Maternal Grandfather    Diabetes Maternal Grandfather    Cancer Paternal Grandmother    Alzheimer's disease Paternal Grandmother    Cancer Paternal Grandfather    Diabetes Paternal Grandfather     Social History Social History   Tobacco Use   Smoking status: Former    Current packs/day: 0.00    Types: Cigarettes    Quit date: 11/09/2010    Years since quitting: 13.0   Smokeless tobacco: Never  Substance Use Topics   Alcohol use: No    Comment: 2-3 per week   Drug use: No    No Known Allergies  Current Outpatient Medications  Medication Sig Dispense Refill   ALPRAZolam (XANAX) 0.5 MG tablet Take 0.5 mg by mouth at bedtime as needed for anxiety.     Citalopram Hydrobromide (CELEXA PO) Take by mouth.     Cyanocobalamin  (B-12 PO) Take 1 tablet by mouth daily.     Multiple Vitamin (MULTIVITAMIN WITH MINERALS) TABS tablet Take 1 tablet by mouth daily.     sertraline (ZOLOFT) 100 MG tablet Take 100 mg by mouth daily.     No current facility-administered medications for this visit.    REVIEW OF SYSTEMS:   Review of Systems  Constitutional: Negative for appetite change, chills, fatigue, fever and unexpected weight change.  HENT: Negative for mouth sores,  nosebleeds, sore throat and trouble swallowing.   Eyes: Negative for eye problems and icterus.  Respiratory: Negative for cough, hemoptysis, shortness of breath and wheezing.   Cardiovascular: Negative for chest pain and leg swelling.  Gastrointestinal: Negative for abdominal pain, constipation, diarrhea, nausea and vomiting.  Genitourinary: Negative for bladder incontinence, difficulty urinating, dysuria, frequency and hematuria.   Musculoskeletal: Negative for back pain, gait problem, neck pain and neck stiffness.  Skin: Negative for itching and rash.  Neurological: Negative for dizziness, extremity weakness, gait problem, headaches, light-headedness and seizures.  Hematological: Negative for adenopathy. Does not bruise/bleed easily.  Psychiatric/Behavioral: Negative for confusion, depression and sleep disturbance. The patient is not nervous/anxious.     PHYSICAL EXAMINATION:  Blood pressure 129/84, pulse 74, temperature 98.9 F (37.2 C), temperature source Temporal, resp. rate 14, weight 162 lb 8 oz (73.7 kg), SpO2 98%, unknown if currently breastfeeding.  ECOG PERFORMANCE STATUS: 0  Physical Exam  Constitutional: Oriented to person, place, and time and well-developed,  well-nourished, and in no distress.  HENT:  Head: Normocephalic and atraumatic.  Mouth/Throat: Oropharynx is clear and moist. No oropharyngeal exudate.  Eyes: Conjunctivae are normal. Right eye exhibits no discharge. Left eye exhibits no discharge. No scleral icterus.  Neck: Normal range of motion. Neck supple.  Cardiovascular: Normal rate, regular rhythm, normal heart sounds and intact distal pulses.   Pulmonary/Chest: Effort normal and breath sounds normal. No respiratory distress. No wheezes. No rales.  Abdominal: Soft. Bowel sounds are normal. Exhibits no distension and no mass. There is no tenderness.  Musculoskeletal: Normal range of motion. Exhibits no edema.  Lymphadenopathy:    No cervical adenopathy.   Neurological: Alert and oriented to person, place, and time. Exhibits normal muscle tone. Gait normal. Coordination normal.  Skin: Skin is warm and dry. No rash noted. Not diaphoretic. No erythema. No pallor.  Psychiatric: Mood, memory and judgment normal.  Vitals reviewed.  LABORATORY DATA: Lab Results  Component Value Date   WBC 11.4 (H) 01/12/2018   HGB 10.8 (L) 01/12/2018   HCT 31.8 (L) 01/12/2018   MCV 92.2 01/12/2018   PLT 120 (L) 01/12/2018      Chemistry   No results found for: NA, K, CL, CO2, BUN, CREATININE, GLU No results found for: CALCIUM, ALKPHOS, AST, ALT, BILITOT     RADIOGRAPHIC STUDIES: No results found.  ASSESSMENT: This is a very pleasant 43 year old Caucasian female with a past medical history significant for anxiety and disk protrusion is evaluated for leukopenia and thrombocytopenia.   The patient was seen with Dr. Federico today.  After review of the labs, review of the records, and discussion with the patient the patients findings are most consistent with thrombocytopenia.    We discussed potential causes of thrombocytopenia include liver disease, splenomegaly, infectious process, nutritional deficiency, consumption/autoimmune destruction, pseudothrombocytopenia, and bone marrow disorders.   She also has leukocytopenia of unclear etiology.    The differential for neutropenia includes nutritional deficiency, inflammatory disorder, medication side effect, infectious etiology, or congenital condition (such as benign ethnic neutropenia). She is on Celexa and started this about a year ago which could be contributing.   #Leukopenia,Unclear Etiology  --The patient was seen with Dr. Federico --Workup for this condition includes:  --repeat CBC and CMP today --infectious serology testing with Hep B, Hep C, and HIV today.  --nutritional evaluation with Vitamin b12, methylmalonic acid, and folate  --inflammatory workup with ESR and CRP   --I will call her with the results early next week once I have everything resulted --If not clear findings, Dr. Federico feels this could be secondary to Celexa --Will determine follow up instructions based on pending labs from today.    # Thrombocytopenia  --The patient was seen with Dr. Federico --Evaluation should include:  --Nutritional etiologies should be ruled out with Vitamin b12 and folate testing.  --immature platelet fraction and platelet by citrate to rule out clumping which can cause pseudothrombocytopenia.  --viral serologies with Hepatitis B, Hepatitis C, and HIV  --evaluate for liver disease/splenomegaly with abdominal US  if labs do not reveal a clear source.  --She will continue to take B12 orally by now due to her current dietary practices/restriction --I will call with the results and follow up instructions based on all of the lab tests  Family History of stroke and malignancy  -The patient was concerned due to a cousin having a stroke at a young age and was found to have MUTHY and BRCA2 and abnormalities in factor II.  -Her  mother had breast cancer in 2011 and was tested for BRCA and was negative.  --Dr. Federico reviewed this with the patient and she does not require testing  The patient voices understanding of current disease status and treatment options and is in agreement with the current care plan.  All questions were answered. The patient knows to call the clinic with any problems, questions or concerns. We can certainly see the patient much sooner if necessary.  Thank you so much for allowing me to participate in the care of Anna Blackwell. I will continue to follow up the patient with you and assist in her care.  Disclaimer: This note was dictated with voice recognition software. Similar sounding words can inadvertently be transcribed and may not be corrected upon review.   Daimon Kean L Temima Kutsch November 12, 2023, 9:15 AM  I have read the above note and  personally examined the patient. I agree with the assessment and plan as noted above.  Briefly Anna Blackwell is a 43 year old female who presents for evaluation of mild leukopenia and thrombocytopenia.  At this time the etiology of the finding is unclear.  Would recommend pursuing full nutritional workup to include vitamin B12, folate, methylmalonic acid.  Additionally we will order immature platelet fraction and platelets in citrate.  In the event no appear etiology can be found for more workup would recommend pursuing an ultrasound of the liver.  The patient voiced understanding of our findings and plan moving forward.   Norleen IVAR Federico, MD Department of Hematology/Oncology [facility] at [facility] Phone: [phone] Pager: [phone] Email: [email or web address]

## 2023-11-11 NOTE — Telephone Encounter (Signed)
 Scheduled appointments per referral. Patient is aware of the appointment time and date as well as the address. Patient was informed to arrive 10-15 minutes prior with updated insurance information. All questions were answered.

## 2023-11-12 ENCOUNTER — Inpatient Hospital Stay: Payer: Managed Care, Other (non HMO)

## 2023-11-12 ENCOUNTER — Inpatient Hospital Stay: Payer: Managed Care, Other (non HMO) | Attending: Physician Assistant | Admitting: Physician Assistant

## 2023-11-12 VITALS — BP 129/84 | HR 74 | Temp 98.9°F | Resp 14 | Wt 162.5 lb

## 2023-11-12 DIAGNOSIS — D696 Thrombocytopenia, unspecified: Secondary | ICD-10-CM

## 2023-11-12 DIAGNOSIS — D72819 Decreased white blood cell count, unspecified: Secondary | ICD-10-CM | POA: Diagnosis not present

## 2023-11-12 DIAGNOSIS — Z803 Family history of malignant neoplasm of breast: Secondary | ICD-10-CM | POA: Insufficient documentation

## 2023-11-12 DIAGNOSIS — Z800 Family history of malignant neoplasm of digestive organs: Secondary | ICD-10-CM | POA: Diagnosis not present

## 2023-11-12 DIAGNOSIS — Z823 Family history of stroke: Secondary | ICD-10-CM | POA: Diagnosis not present

## 2023-11-12 DIAGNOSIS — Z87891 Personal history of nicotine dependence: Secondary | ICD-10-CM | POA: Diagnosis not present

## 2023-11-12 LAB — CBC WITH DIFFERENTIAL (CANCER CENTER ONLY)
Abs Immature Granulocytes: 0.01 10*3/uL (ref 0.00–0.07)
Basophils Absolute: 0 10*3/uL (ref 0.0–0.1)
Basophils Relative: 1 %
Eosinophils Absolute: 0.2 10*3/uL (ref 0.0–0.5)
Eosinophils Relative: 6 %
HCT: 44 % (ref 36.0–46.0)
Hemoglobin: 14.5 g/dL (ref 12.0–15.0)
Immature Granulocytes: 0 %
Lymphocytes Relative: 30 %
Lymphs Abs: 1.1 10*3/uL (ref 0.7–4.0)
MCH: 31.5 pg (ref 26.0–34.0)
MCHC: 33 g/dL (ref 30.0–36.0)
MCV: 95.7 fL (ref 80.0–100.0)
Monocytes Absolute: 0.2 10*3/uL (ref 0.1–1.0)
Monocytes Relative: 6 %
Neutro Abs: 2 10*3/uL (ref 1.7–7.7)
Neutrophils Relative %: 57 %
Platelet Count: 170 10*3/uL (ref 150–400)
RBC: 4.6 MIL/uL (ref 3.87–5.11)
RDW: 12.2 % (ref 11.5–15.5)
WBC Count: 3.6 10*3/uL — ABNORMAL LOW (ref 4.0–10.5)
nRBC: 0 % (ref 0.0–0.2)

## 2023-11-12 LAB — CMP (CANCER CENTER ONLY)
ALT: 14 U/L (ref 0–44)
AST: 14 U/L — ABNORMAL LOW (ref 15–41)
Albumin: 4.5 g/dL (ref 3.5–5.0)
Alkaline Phosphatase: 47 U/L (ref 38–126)
Anion gap: 6 (ref 5–15)
BUN: 14 mg/dL (ref 6–20)
CO2: 29 mmol/L (ref 22–32)
Calcium: 9.4 mg/dL (ref 8.9–10.3)
Chloride: 104 mmol/L (ref 98–111)
Creatinine: 0.84 mg/dL (ref 0.44–1.00)
GFR, Estimated: >60 mL/min (ref >60)
Glucose, Bld: 97 mg/dL (ref 70–99)
Potassium: 4 mmol/L (ref 3.5–5.1)
Sodium: 139 mmol/L (ref 135–145)
Total Bilirubin: 0.8 mg/dL (ref 0.0–1.2)
Total Protein: 7.4 g/dL (ref 6.5–8.1)

## 2023-11-12 LAB — WBC/PLT IN CITRATE

## 2023-11-12 LAB — HIV ANTIBODY (ROUTINE TESTING W REFLEX): HIV Screen 4th Generation wRfx: NONREACTIVE

## 2023-11-12 LAB — VITAMIN B12: Vitamin B-12: 2177 pg/mL — ABNORMAL HIGH (ref 180–914)

## 2023-11-12 LAB — HEPATITIS B SURFACE ANTIBODY,QUALITATIVE: Hep B S Ab: NONREACTIVE

## 2023-11-12 LAB — C-REACTIVE PROTEIN: CRP: <0.5 mg/dL (ref <1.0)

## 2023-11-12 LAB — HEPATITIS B SURFACE ANTIGEN: Hepatitis B Surface Ag: NONREACTIVE

## 2023-11-12 LAB — IMMATURE PLATELET FRACTION: Immature Platelet Fraction: 4.1 % (ref 1.2–8.6)

## 2023-11-12 LAB — FOLATE: Folate: 28.4 ng/mL (ref >5.9)

## 2023-11-12 LAB — SEDIMENTATION RATE: Sed Rate: 2 mm/h (ref 0–22)

## 2023-11-13 LAB — HEPATITIS B CORE ANTIBODY, TOTAL: HEP B CORE AB: NEGATIVE

## 2023-11-15 ENCOUNTER — Other Ambulatory Visit: Payer: Self-pay | Admitting: Physician Assistant

## 2023-11-15 DIAGNOSIS — D696 Thrombocytopenia, unspecified: Secondary | ICD-10-CM

## 2023-11-15 NOTE — Progress Notes (Signed)
 I called the patient to review the labs from her visit last week on 11/11/2022.  The patient has discontinued her vitamin B12 as her value was high and she did not need additional supplementation.  Her inflammatory markers and viral testing were negative.  We will proceed with a ultrasound of the liver to rule out any evidence of liver disease.  This is negative, then we recommend routine follow-up every 3 to 6 months with lab work.  The patient was appreciative of the phone call.

## 2023-11-16 ENCOUNTER — Encounter: Payer: Managed Care, Other (non HMO) | Admitting: Internal Medicine

## 2023-11-16 ENCOUNTER — Other Ambulatory Visit: Payer: Managed Care, Other (non HMO)

## 2023-11-16 LAB — METHYLMALONIC ACID, SERUM: Methylmalonic Acid, Quantitative: 115 nmol/L (ref 0–378)

## 2023-11-17 ENCOUNTER — Telehealth: Payer: Self-pay | Admitting: Hematology and Oncology

## 2023-11-17 ENCOUNTER — Telehealth: Payer: Self-pay

## 2023-11-17 NOTE — Telephone Encounter (Signed)
Scheduled appointments per 2/12 scheduling. Patient is aware of the made appointments.

## 2023-11-17 NOTE — Telephone Encounter (Signed)
Received message from patient about questions of lab results and concerns of copay for liver US.  Per Cassie, PA-  the liver US is not urgent.  Recheck labs in 3-6 months. Cassie spoke with patient 2/10 about lab results and informed patient if any other questions, please give our office a call back. LVM for return call if any questions.

## 2023-11-24 ENCOUNTER — Ambulatory Visit (HOSPITAL_COMMUNITY): Payer: Managed Care, Other (non HMO)

## 2024-03-27 ENCOUNTER — Other Ambulatory Visit: Payer: Self-pay | Admitting: Hematology and Oncology

## 2024-03-27 ENCOUNTER — Inpatient Hospital Stay (HOSPITAL_BASED_OUTPATIENT_CLINIC_OR_DEPARTMENT_OTHER): Payer: Managed Care, Other (non HMO) | Admitting: Hematology and Oncology

## 2024-03-27 ENCOUNTER — Inpatient Hospital Stay: Payer: Managed Care, Other (non HMO) | Attending: Hematology and Oncology

## 2024-03-27 VITALS — BP 125/57 | HR 62 | Temp 98.7°F | Resp 13 | Wt 168.1 lb

## 2024-03-27 DIAGNOSIS — D72819 Decreased white blood cell count, unspecified: Secondary | ICD-10-CM | POA: Diagnosis not present

## 2024-03-27 DIAGNOSIS — Z823 Family history of stroke: Secondary | ICD-10-CM | POA: Insufficient documentation

## 2024-03-27 DIAGNOSIS — D696 Thrombocytopenia, unspecified: Secondary | ICD-10-CM | POA: Diagnosis not present

## 2024-03-27 DIAGNOSIS — Z87891 Personal history of nicotine dependence: Secondary | ICD-10-CM | POA: Insufficient documentation

## 2024-03-27 DIAGNOSIS — Z803 Family history of malignant neoplasm of breast: Secondary | ICD-10-CM | POA: Insufficient documentation

## 2024-03-27 LAB — CBC WITH DIFFERENTIAL (CANCER CENTER ONLY)
Abs Immature Granulocytes: 0.01 10*3/uL (ref 0.00–0.07)
Basophils Absolute: 0 10*3/uL (ref 0.0–0.1)
Basophils Relative: 1 %
Eosinophils Absolute: 0.2 10*3/uL (ref 0.0–0.5)
Eosinophils Relative: 5 %
HCT: 39 % (ref 36.0–46.0)
Hemoglobin: 13.4 g/dL (ref 12.0–15.0)
Immature Granulocytes: 0 %
Lymphocytes Relative: 33 %
Lymphs Abs: 1.5 10*3/uL (ref 0.7–4.0)
MCH: 31.6 pg (ref 26.0–34.0)
MCHC: 34.4 g/dL (ref 30.0–36.0)
MCV: 92 fL (ref 80.0–100.0)
Monocytes Absolute: 0.4 10*3/uL (ref 0.1–1.0)
Monocytes Relative: 10 %
Neutro Abs: 2.3 10*3/uL (ref 1.7–7.7)
Neutrophils Relative %: 51 %
Platelet Count: 129 10*3/uL — ABNORMAL LOW (ref 150–400)
RBC: 4.24 MIL/uL (ref 3.87–5.11)
RDW: 12.7 % (ref 11.5–15.5)
WBC Count: 4.5 10*3/uL (ref 4.0–10.5)
nRBC: 0 % (ref 0.0–0.2)

## 2024-03-27 LAB — CMP (CANCER CENTER ONLY)
ALT: 18 U/L (ref 0–44)
AST: 19 U/L (ref 15–41)
Albumin: 4.3 g/dL (ref 3.5–5.0)
Alkaline Phosphatase: 36 U/L — ABNORMAL LOW (ref 38–126)
Anion gap: 6 (ref 5–15)
BUN: 17 mg/dL (ref 6–20)
CO2: 25 mmol/L (ref 22–32)
Calcium: 9.1 mg/dL (ref 8.9–10.3)
Chloride: 107 mmol/L (ref 98–111)
Creatinine: 0.79 mg/dL (ref 0.44–1.00)
GFR, Estimated: >60 mL/min (ref >60)
Glucose, Bld: 90 mg/dL (ref 70–99)
Potassium: 4.2 mmol/L (ref 3.5–5.1)
Sodium: 138 mmol/L (ref 135–145)
Total Bilirubin: 0.7 mg/dL (ref 0.0–1.2)
Total Protein: 7 g/dL (ref 6.5–8.1)

## 2024-03-27 NOTE — Progress Notes (Unsigned)
 [facility] Telephone:([phone]   Fax:([phone]  PROGRESS NOTE  Patient Care Team: Gorge Ade, MD as PCP - General (Obstetrics and Gynecology)  Hematological/Oncological History # Thrombocytopenia/Leukopenia   Interval History:  Anna Blackwell 43 y.o. female with medical history significant for thrombocytopenia/leukopenia presents for a follow up visit. The patient's last visit was on 11/12/2023. In the interim since the last visit she was scheduled for an abdominal ultrasound but canceled this due to a markedly high co-pay of $500.  On exam today Anna Blackwell reports that she is feeling well.  She has had no changes in her health since we last talked and she is feeling great.  She reports that she has been hydrating well and exercising.  She has had no trouble with bleeding, bruising, or dark stools.  She reports has an IUD in place and has very light menstrual cycles.  She reports that with her cycle she does have some tenderness of her breasts.  She notes that she is not having any issues with runny nose, sore throat, cough.  She denies any fevers, chills, sweats.  Overall she feels well and has no major questions concerns or complaints today.  Full 10 point ROS is otherwise negative.  MEDICAL HISTORY:  Past Medical History:  Diagnosis Date   Anxiety    GERD (gastroesophageal reflux disease)    occasionally with milk products   Missed abortion 03/28/2016   No pertinent past medical history    PONV (postoperative nausea and vomiting)    Ruptured cervical disc    S/P dilatation and curettage 03/30/2016   Vaginal delivery 2012    SURGICAL HISTORY: Past Surgical History:  Procedure Laterality Date   CESAREAN SECTION N/A 01/11/2018   Procedure: Primary CESAREAN SECTION;  Surgeon: Gorge Ade, MD;  Location: [facility];  Service: Obstetrics;  Laterality: N/A;  EDD: 01/22/18   DILATATION & CURETTAGE/HYSTEROSCOPY WITH MYOSURE N/A 08/12/2016    Procedure: DILATATION & CURETTAGE/HYSTEROSCOPY WITH MYOSURE;  Surgeon: Ade Gorge, MD;  Location: [facility];  Service: Gynecology;  Laterality: N/A;   DILATION AND EVACUATION N/A 03/30/2016   Procedure: DILATATION AND EVACUATION WITH INTRAOPERATIVE ULTRASOUND;  Surgeon: Ezzie Buba, MD;  Location: [facility];  Service: Gynecology;  Laterality: N/A;   WISDOM TOOTH EXTRACTION      SOCIAL HISTORY: Social History   Socioeconomic History   Marital status: Married    Spouse name: Not on file   Number of children: Not on file   Years of education: Not on file   Highest education level: Not on file  Occupational History   Not on file  Tobacco Use   Smoking status: Former    Current packs/day: 0.00    Types: Cigarettes    Quit date: 11/09/2010    Years since quitting: 13.3   Smokeless tobacco: Never  Substance and Sexual Activity   Alcohol use: No    Comment: 2-3 per week   Drug use: No   Sexual activity: Yes  Other Topics Concern   Not on file  Social History Narrative   Not on file   Social Drivers of Health   Financial Resource Strain: Not on file  Food Insecurity: Not on file  Transportation Needs: Not on file  Physical Activity: Not on file  Stress: Not on file  Social Connections: Not on file  Intimate Partner Violence: Not on file    FAMILY HISTORY: Family History  Problem Relation Age of Onset   Breast cancer Mother  54   Hypertension Mother    Cancer Mother    Drug abuse Sister    Bipolar disorder Sister    Thyroid  disease Maternal Aunt    Hypertension Maternal Grandmother    Cancer Maternal Grandfather    Diabetes Maternal Grandfather    Cancer Paternal Grandmother    Alzheimer's disease Paternal Grandmother    Cancer Paternal Grandfather    Diabetes Paternal Grandfather     ALLERGIES:  has no known allergies.  MEDICATIONS:  Current Outpatient Medications  Medication Sig Dispense Refill   ALPRAZolam (XANAX) 0.5 MG tablet Take 0.5 mg by mouth at  bedtime as needed for anxiety.     Citalopram Hydrobromide (CELEXA PO) Take by mouth.     Cyanocobalamin  (B-12 PO) Take 1 tablet by mouth daily.     Multiple Vitamin (MULTIVITAMIN WITH MINERALS) TABS tablet Take 1 tablet by mouth daily.     sertraline (ZOLOFT) 100 MG tablet Take 100 mg by mouth daily.     No current facility-administered medications for this visit.    REVIEW OF SYSTEMS:   Constitutional: ( - ) fevers, ( - )  chills , ( - ) night sweats Eyes: ( - ) blurriness of vision, ( - ) double vision, ( - ) watery eyes Ears, nose, mouth, throat, and face: ( - ) mucositis, ( - ) sore throat Respiratory: ( - ) cough, ( - ) dyspnea, ( - ) wheezes Cardiovascular: ( - ) palpitation, ( - ) chest discomfort, ( - ) lower extremity swelling Gastrointestinal:  ( - ) nausea, ( - ) heartburn, ( - ) change in bowel habits Skin: ( - ) abnormal skin rashes Lymphatics: ( - ) new lymphadenopathy, ( - ) easy bruising Neurological: ( - ) numbness, ( - ) tingling, ( - ) new weaknesses Behavioral/Psych: ( - ) mood change, ( - ) new changes  All other systems were reviewed with the patient and are negative.  PHYSICAL EXAMINATION: Vitals:   03/27/24 1002  BP: (!) 125/57  Pulse: 62  Resp: 13  Temp: 98.7 F (37.1 C)  SpO2: 100%   Filed Weights   03/27/24 1002  Weight: 168 lb 1.6 oz (76.2 kg)    GENERAL: Well-appearing middle-age Caucasian female, alert, no distress and comfortable SKIN: skin color, texture, turgor are normal, no rashes or significant lesions EYES: conjunctiva are pink and non-injected, sclera clear LUNGS: clear to auscultation and percussion with normal breathing effort HEART: regular rate & rhythm and no murmurs and no lower extremity edema Musculoskeletal: no cyanosis of digits and no clubbing  PSYCH: alert & oriented x 3, fluent speech NEURO: no focal motor/sensory deficits  LABORATORY DATA:  I have reviewed the data as listed    Latest Ref Rng & Units 03/27/2024     9:08 AM 11/12/2023    9:16 AM 01/12/2018    5:25 AM  CBC  WBC 4.0 - 10.5 K/uL 4.5  3.6  11.4   Hemoglobin 12.0 - 15.0 g/dL 86.5  85.4  89.1   Hematocrit 36.0 - 46.0 % 39.0  44.0  31.8   Platelets 150 - 400 K/uL 129  170  120        Latest Ref Rng & Units 03/27/2024    9:08 AM 11/12/2023    9:16 AM  CMP  Glucose 70 - 99 mg/dL 90  97   BUN 6 - 20 mg/dL 17  14   Creatinine 9.55 - 1.00 mg/dL 9.20  9.15  Sodium 135 - 145 mmol/L 138  139   Potassium 3.5 - 5.1 mmol/L 4.2  4.0   Chloride 98 - 111 mmol/L 107  104   CO2 22 - 32 mmol/L 25  29   Calcium 8.9 - 10.3 mg/dL 9.1  9.4   Total Protein 6.5 - 8.1 g/dL 7.0  7.4   Total Bilirubin 0.0 - 1.2 mg/dL 0.7  0.8   Alkaline Phos 38 - 126 U/L 36  47   AST 15 - 41 U/L 19  14   ALT 0 - 44 U/L 18  14    RADIOGRAPHIC STUDIES: No results found.  ASSESSMENT & PLAN Anna Blackwell 43 y.o. female with medical history significant for thrombocytopenia/leukopenia who presents for a follow up visit.   #Leukopenia,Unclear Etiology  # Thrombocytopenia, Mild  --Negative infectious serology testing with Hep B, Hep C, and HIV  --nutritional evaluation with Vitamin b12, methylmalonic acid, and folate was unremarkable --inflammatory workup with ESR and CRP also negative  --labs today show white blood cell 4.5, hemoglobin 13.4, MCV 92.0, platelets 129 --Abdominal US  ordered but held due to high co-pay.  At this time low suspicion of liver disease, if levels continue to drop would recommend reconsidering. --Consider bone marrow biopsy if counts worsen. --At this time working diagnosis of ITP.  White blood cells today are low normal --RTC in 6 months for labs and 12 months for clinic visit     Family History of stroke and malignancy  -The patient was concerned due to a cousin having a stroke at a young age and was found to have MUTHY and BRCA2 and abnormalities in factor II.  -Her mother had breast cancer in 2011 and was tested for BRCA and was negative.    Orders Placed This Encounter  Procedures   US  Abdomen Complete    Standing Status:   Future    Expected Date:   04/03/2024    Expiration Date:   03/27/2025    Reason for Exam (SYMPTOM  OR DIAGNOSIS REQUIRED):   assess for liver disease/splenomegaly due to low Plt    Preferred imaging location?:   [facility]    All questions were answered. The patient knows to call the clinic with any problems, questions or concerns.  A total of more than 30 minutes were spent on this encounter with face-to-face time and non-face-to-face time, including preparing to see the patient, ordering tests and/or medications, counseling the patient and coordination of care as outlined above.   Norleen IVAR Kidney, MD Department of Hematology/Oncology [facility] at [facility] Phone: [phone] Pager: [phone] Email: [email or web address]  03/28/2024 10:33 AM

## 2024-03-28 ENCOUNTER — Telehealth: Payer: Self-pay | Admitting: Hematology and Oncology

## 2024-03-28 NOTE — Telephone Encounter (Signed)
 Scheduled appointments per 6/23 los. Talked with the patient and she is aware of the made appointments.

## 2024-04-03 ENCOUNTER — Ambulatory Visit (HOSPITAL_COMMUNITY)

## 2024-05-15 ENCOUNTER — Other Ambulatory Visit: Payer: Self-pay | Admitting: Family Medicine

## 2024-05-15 DIAGNOSIS — Z1231 Encounter for screening mammogram for malignant neoplasm of breast: Secondary | ICD-10-CM

## 2024-05-22 DIAGNOSIS — Z0000 Encounter for general adult medical examination without abnormal findings: Secondary | ICD-10-CM | POA: Diagnosis not present

## 2024-05-22 DIAGNOSIS — F411 Generalized anxiety disorder: Secondary | ICD-10-CM | POA: Diagnosis not present

## 2024-06-10 ENCOUNTER — Other Ambulatory Visit: Payer: Self-pay | Admitting: Medical Genetics

## 2024-06-12 ENCOUNTER — Ambulatory Visit
Admission: RE | Admit: 2024-06-12 | Discharge: 2024-06-12 | Disposition: A | Discharge status: Home / Self Care | Source: Ambulatory Visit | Attending: Family Medicine | Admitting: Family Medicine

## 2024-06-12 DIAGNOSIS — Z1231 Encounter for screening mammogram for malignant neoplasm of breast: Secondary | ICD-10-CM | POA: Diagnosis not present

## 2024-06-13 DIAGNOSIS — Z03818 Encounter for observation for suspected exposure to other biological agents ruled out: Secondary | ICD-10-CM | POA: Diagnosis not present

## 2024-06-13 DIAGNOSIS — J069 Acute upper respiratory infection, unspecified: Secondary | ICD-10-CM | POA: Diagnosis not present

## 2024-06-13 DIAGNOSIS — R059 Cough, unspecified: Secondary | ICD-10-CM | POA: Diagnosis not present

## 2024-07-11 ENCOUNTER — Other Ambulatory Visit

## 2024-07-11 DIAGNOSIS — Z006 Encounter for examination for normal comparison and control in clinical research program: Secondary | ICD-10-CM

## 2024-07-22 LAB — GENECONNECT MOLECULAR SCREEN: Genetic Analysis Overall Interpretation: NEGATIVE

## 2024-08-01 DIAGNOSIS — N959 Unspecified menopausal and perimenopausal disorder: Secondary | ICD-10-CM | POA: Diagnosis not present

## 2024-08-01 DIAGNOSIS — E559 Vitamin D deficiency, unspecified: Secondary | ICD-10-CM | POA: Diagnosis not present

## 2024-08-01 DIAGNOSIS — D519 Vitamin B12 deficiency anemia, unspecified: Secondary | ICD-10-CM | POA: Diagnosis not present

## 2024-08-01 DIAGNOSIS — R635 Abnormal weight gain: Secondary | ICD-10-CM | POA: Diagnosis not present

## 2024-09-26 ENCOUNTER — Other Ambulatory Visit

## 2024-10-17 ENCOUNTER — Encounter: Payer: Self-pay | Admitting: Physician Assistant

## 2024-10-20 ENCOUNTER — Telehealth: Payer: Self-pay | Admitting: Hematology and Oncology

## 2024-10-20 NOTE — Telephone Encounter (Signed)
 pt Called to reschedule upcoming appt

## 2024-10-24 ENCOUNTER — Inpatient Hospital Stay

## 2024-10-25 ENCOUNTER — Inpatient Hospital Stay: Attending: Hematology and Oncology

## 2024-10-25 ENCOUNTER — Other Ambulatory Visit: Payer: Self-pay

## 2024-10-25 DIAGNOSIS — D696 Thrombocytopenia, unspecified: Secondary | ICD-10-CM | POA: Diagnosis present

## 2024-10-25 LAB — CMP (CANCER CENTER ONLY)
ALT: 12 U/L (ref 0–44)
AST: 18 U/L (ref 15–41)
Albumin: 4.3 g/dL (ref 3.5–5.0)
Alkaline Phosphatase: 50 U/L (ref 38–126)
Anion gap: 10 (ref 5–15)
BUN: 14 mg/dL (ref 6–20)
CO2: 25 mmol/L (ref 22–32)
Calcium: 9.2 mg/dL (ref 8.9–10.3)
Chloride: 104 mmol/L (ref 98–111)
Creatinine: 0.91 mg/dL (ref 0.44–1.00)
GFR, Estimated: >60 mL/min
Glucose, Bld: 99 mg/dL (ref 70–99)
Potassium: 5 mmol/L (ref 3.5–5.1)
Sodium: 139 mmol/L (ref 135–145)
Total Bilirubin: 0.8 mg/dL (ref 0.0–1.2)
Total Protein: 6.9 g/dL (ref 6.5–8.1)

## 2024-10-25 LAB — CBC WITH DIFFERENTIAL (CANCER CENTER ONLY)
Abs Immature Granulocytes: 0.02 K/uL (ref 0.00–0.07)
Basophils Absolute: 0 K/uL (ref 0.0–0.1)
Basophils Relative: 1 %
Eosinophils Absolute: 0.3 K/uL (ref 0.0–0.5)
Eosinophils Relative: 6 %
HCT: 41.2 % (ref 36.0–46.0)
Hemoglobin: 13.9 g/dL (ref 12.0–15.0)
Immature Granulocytes: 1 %
Lymphocytes Relative: 32 %
Lymphs Abs: 1.4 K/uL (ref 0.7–4.0)
MCH: 30.8 pg (ref 26.0–34.0)
MCHC: 33.7 g/dL (ref 30.0–36.0)
MCV: 91.2 fL (ref 80.0–100.0)
Monocytes Absolute: 0.3 K/uL (ref 0.1–1.0)
Monocytes Relative: 7 %
Neutro Abs: 2.3 K/uL (ref 1.7–7.7)
Neutrophils Relative %: 53 %
Platelet Count: 143 K/uL — ABNORMAL LOW (ref 150–400)
RBC: 4.52 MIL/uL (ref 3.87–5.11)
RDW: 13.2 % (ref 11.5–15.5)
WBC Count: 4.2 K/uL (ref 4.0–10.5)
nRBC: 0 % (ref 0.0–0.2)

## 2025-03-28 ENCOUNTER — Ambulatory Visit: Admitting: Hematology and Oncology

## 2025-03-28 ENCOUNTER — Other Ambulatory Visit
